# Patient Record
Sex: Female | Born: 1935 | Race: White | Hispanic: No | Marital: Married | Smoking: Never smoker
Health system: Southern US, Community
[De-identification: ages and names within clinical notes are randomized; demographics above are authoritative.]

## PROBLEM LIST (undated history)

## (undated) DIAGNOSIS — I10 Essential (primary) hypertension: Secondary | ICD-10-CM

## (undated) DIAGNOSIS — N952 Postmenopausal atrophic vaginitis: Secondary | ICD-10-CM

## (undated) DIAGNOSIS — N9489 Other specified conditions associated with female genital organs and menstrual cycle: Secondary | ICD-10-CM

## (undated) DIAGNOSIS — E039 Hypothyroidism, unspecified: Secondary | ICD-10-CM

## (undated) DIAGNOSIS — N870 Mild cervical dysplasia: Secondary | ICD-10-CM

## (undated) DIAGNOSIS — C801 Malignant (primary) neoplasm, unspecified: Secondary | ICD-10-CM

## (undated) DIAGNOSIS — E785 Hyperlipidemia, unspecified: Secondary | ICD-10-CM

## (undated) HISTORY — PX: MASTECTOMY: SHX3

## (undated) HISTORY — DX: Malignant (primary) neoplasm, unspecified: C80.1

## (undated) HISTORY — DX: Hypothyroidism, unspecified: E03.9

## (undated) HISTORY — PX: BREAST SURGERY: SHX581

## (undated) HISTORY — PX: BILATERAL SALPINGOOPHORECTOMY: SHX1223

## (undated) HISTORY — PX: COLONOSCOPY W/ POLYPECTOMY: SHX1380

## (undated) HISTORY — DX: Other specified conditions associated with female genital organs and menstrual cycle: N94.89

## (undated) HISTORY — DX: Essential (primary) hypertension: I10

## (undated) HISTORY — DX: Hyperlipidemia, unspecified: E78.5

## (undated) HISTORY — DX: Postmenopausal atrophic vaginitis: N95.2

## (undated) HISTORY — DX: Mild cervical dysplasia: N87.0

## (undated) HISTORY — PX: COLPOSCOPY: SHX161

## (undated) HISTORY — PX: APPENDECTOMY: SHX54

---

## 1980-04-30 HISTORY — PX: OOPHORECTOMY: SHX86

## 1980-04-30 HISTORY — PX: ABDOMINAL HYSTERECTOMY: SHX81

## 1997-07-29 ENCOUNTER — Encounter: Admission: RE | Admit: 1997-07-29 | Discharge: 1997-10-27 | Payer: Self-pay | Admitting: Radiation Oncology

## 1997-08-24 ENCOUNTER — Encounter: Admission: RE | Admit: 1997-08-24 | Discharge: 1997-11-22 | Payer: Self-pay | Admitting: Surgery

## 1997-10-29 ENCOUNTER — Inpatient Hospital Stay (HOSPITAL_COMMUNITY): Admission: RE | Admit: 1997-10-29 | Discharge: 1997-11-01 | Payer: Self-pay | Admitting: Plastic Surgery

## 1997-11-26 ENCOUNTER — Ambulatory Visit (HOSPITAL_COMMUNITY): Admission: RE | Admit: 1997-11-26 | Discharge: 1997-11-26 | Payer: Self-pay | Admitting: Oncology

## 1998-01-19 ENCOUNTER — Ambulatory Visit (HOSPITAL_BASED_OUTPATIENT_CLINIC_OR_DEPARTMENT_OTHER): Admission: RE | Admit: 1998-01-19 | Discharge: 1998-01-19 | Payer: Self-pay | Admitting: Plastic Surgery

## 1998-04-13 ENCOUNTER — Other Ambulatory Visit: Admission: RE | Admit: 1998-04-13 | Discharge: 1998-04-13 | Payer: Self-pay | Admitting: Plastic Surgery

## 1998-05-23 ENCOUNTER — Other Ambulatory Visit: Admission: RE | Admit: 1998-05-23 | Discharge: 1998-05-23 | Payer: Self-pay | Admitting: Radiology

## 1998-12-29 ENCOUNTER — Other Ambulatory Visit: Admission: RE | Admit: 1998-12-29 | Discharge: 1998-12-29 | Payer: Self-pay | Admitting: Obstetrics and Gynecology

## 1999-07-21 ENCOUNTER — Encounter (INDEPENDENT_AMBULATORY_CARE_PROVIDER_SITE_OTHER): Payer: Self-pay | Admitting: *Deleted

## 1999-07-21 ENCOUNTER — Ambulatory Visit (HOSPITAL_COMMUNITY): Admission: RE | Admit: 1999-07-21 | Discharge: 1999-07-21 | Payer: Self-pay | Admitting: Gastroenterology

## 1999-12-13 ENCOUNTER — Encounter: Admission: RE | Admit: 1999-12-13 | Discharge: 1999-12-13 | Payer: Self-pay | Admitting: Oncology

## 1999-12-13 ENCOUNTER — Encounter: Payer: Self-pay | Admitting: Oncology

## 2000-02-13 ENCOUNTER — Encounter: Admission: RE | Admit: 2000-02-13 | Discharge: 2000-02-13 | Payer: Self-pay | Admitting: Oncology

## 2000-02-13 ENCOUNTER — Encounter: Payer: Self-pay | Admitting: Oncology

## 2000-03-01 ENCOUNTER — Other Ambulatory Visit: Admission: RE | Admit: 2000-03-01 | Discharge: 2000-03-01 | Payer: Self-pay | Admitting: Obstetrics and Gynecology

## 2001-01-09 ENCOUNTER — Encounter: Payer: Self-pay | Admitting: Oncology

## 2001-01-09 ENCOUNTER — Encounter: Admission: RE | Admit: 2001-01-09 | Discharge: 2001-01-09 | Payer: Self-pay | Admitting: Oncology

## 2001-03-17 ENCOUNTER — Other Ambulatory Visit: Admission: RE | Admit: 2001-03-17 | Discharge: 2001-03-17 | Payer: Self-pay | Admitting: Obstetrics and Gynecology

## 2002-02-26 ENCOUNTER — Other Ambulatory Visit: Admission: RE | Admit: 2002-02-26 | Discharge: 2002-02-26 | Payer: Self-pay | Admitting: Obstetrics and Gynecology

## 2002-06-25 ENCOUNTER — Ambulatory Visit (HOSPITAL_COMMUNITY): Admission: RE | Admit: 2002-06-25 | Discharge: 2002-06-25 | Payer: Self-pay | Admitting: Oncology

## 2002-06-25 ENCOUNTER — Encounter: Payer: Self-pay | Admitting: Oncology

## 2002-11-05 ENCOUNTER — Encounter: Payer: Self-pay | Admitting: Oncology

## 2002-11-05 ENCOUNTER — Ambulatory Visit (HOSPITAL_COMMUNITY): Admission: RE | Admit: 2002-11-05 | Discharge: 2002-11-05 | Payer: Self-pay | Admitting: Oncology

## 2003-02-18 ENCOUNTER — Other Ambulatory Visit: Admission: RE | Admit: 2003-02-18 | Discharge: 2003-02-18 | Payer: Self-pay | Admitting: Obstetrics and Gynecology

## 2004-02-15 ENCOUNTER — Encounter (INDEPENDENT_AMBULATORY_CARE_PROVIDER_SITE_OTHER): Payer: Self-pay | Admitting: Specialist

## 2004-02-15 ENCOUNTER — Ambulatory Visit (HOSPITAL_COMMUNITY): Admission: RE | Admit: 2004-02-15 | Discharge: 2004-02-15 | Payer: Self-pay | Admitting: Gastroenterology

## 2004-02-23 ENCOUNTER — Other Ambulatory Visit: Admission: RE | Admit: 2004-02-23 | Discharge: 2004-02-23 | Payer: Self-pay | Admitting: Obstetrics and Gynecology

## 2004-09-06 ENCOUNTER — Encounter: Admission: RE | Admit: 2004-09-06 | Discharge: 2004-09-06 | Payer: Self-pay | Admitting: Surgery

## 2004-11-01 ENCOUNTER — Ambulatory Visit (HOSPITAL_COMMUNITY): Admission: RE | Admit: 2004-11-01 | Discharge: 2004-11-02 | Payer: Self-pay | Admitting: Surgery

## 2004-11-01 ENCOUNTER — Encounter (INDEPENDENT_AMBULATORY_CARE_PROVIDER_SITE_OTHER): Payer: Self-pay | Admitting: Specialist

## 2004-11-10 ENCOUNTER — Ambulatory Visit: Payer: Self-pay | Admitting: Oncology

## 2004-11-13 ENCOUNTER — Ambulatory Visit (HOSPITAL_COMMUNITY): Admission: RE | Admit: 2004-11-13 | Discharge: 2004-11-13 | Payer: Self-pay | Admitting: Oncology

## 2004-12-29 ENCOUNTER — Ambulatory Visit: Payer: Self-pay | Admitting: Internal Medicine

## 2005-01-17 ENCOUNTER — Ambulatory Visit: Payer: Self-pay | Admitting: Internal Medicine

## 2005-02-09 ENCOUNTER — Ambulatory Visit: Payer: Self-pay | Admitting: Internal Medicine

## 2005-02-15 ENCOUNTER — Ambulatory Visit: Payer: Self-pay | Admitting: Oncology

## 2005-02-22 ENCOUNTER — Other Ambulatory Visit: Admission: RE | Admit: 2005-02-22 | Discharge: 2005-02-22 | Payer: Self-pay | Admitting: Obstetrics and Gynecology

## 2005-10-04 ENCOUNTER — Ambulatory Visit: Payer: Self-pay | Admitting: Oncology

## 2005-10-04 ENCOUNTER — Encounter: Admission: RE | Admit: 2005-10-04 | Discharge: 2005-11-04 | Payer: Self-pay | Admitting: Surgery

## 2005-10-09 LAB — COMPREHENSIVE METABOLIC PANEL
Alkaline Phosphatase: 68 U/L (ref 39–117)
BUN: 19 mg/dL (ref 6–23)
Creatinine, Ser: 0.94 mg/dL (ref 0.40–1.20)
Glucose, Bld: 90 mg/dL (ref 70–99)
Sodium: 140 mEq/L (ref 135–145)
Total Bilirubin: 0.7 mg/dL (ref 0.3–1.2)
Total Protein: 7 g/dL (ref 6.0–8.3)

## 2005-10-09 LAB — CBC WITH DIFFERENTIAL/PLATELET
Eosinophils Absolute: 0.1 10*3/uL (ref 0.0–0.5)
LYMPH%: 27.2 % (ref 14.0–48.0)
MCV: 92.1 fL (ref 81.0–101.0)
MONO%: 7.4 % (ref 0.0–13.0)
NEUT#: 5 10*3/uL (ref 1.5–6.5)
Platelets: 234 10*3/uL (ref 145–400)
RBC: 4.51 10*6/uL (ref 3.70–5.32)

## 2005-10-09 LAB — CANCER ANTIGEN 27.29: CA 27.29: 8 U/mL (ref 0–39)

## 2005-10-23 ENCOUNTER — Ambulatory Visit (HOSPITAL_COMMUNITY): Admission: RE | Admit: 2005-10-23 | Discharge: 2005-10-23 | Payer: Self-pay | Admitting: Plastic Surgery

## 2005-11-05 ENCOUNTER — Encounter: Admission: RE | Admit: 2005-11-05 | Discharge: 2005-11-07 | Payer: Self-pay | Admitting: Surgery

## 2005-11-27 ENCOUNTER — Ambulatory Visit: Payer: Self-pay | Admitting: Internal Medicine

## 2006-02-04 ENCOUNTER — Ambulatory Visit: Payer: Self-pay | Admitting: Oncology

## 2006-02-06 LAB — CBC WITH DIFFERENTIAL/PLATELET
BASO%: 0.6 % (ref 0.0–2.0)
EOS%: 1.9 % (ref 0.0–7.0)
MCH: 31.1 pg (ref 26.0–34.0)
MCHC: 33.8 g/dL (ref 32.0–36.0)
MCV: 91.8 fL (ref 81.0–101.0)
MONO%: 9.4 % (ref 0.0–13.0)
RBC: 4.51 10*6/uL (ref 3.70–5.32)
RDW: 13.8 % (ref 11.3–14.5)

## 2006-02-06 LAB — COMPREHENSIVE METABOLIC PANEL
AST: 28 U/L (ref 0–37)
Albumin: 4.1 g/dL (ref 3.5–5.2)
Alkaline Phosphatase: 62 U/L (ref 39–117)
BUN: 26 mg/dL — ABNORMAL HIGH (ref 6–23)
Potassium: 4.1 mEq/L (ref 3.5–5.3)
Sodium: 138 mEq/L (ref 135–145)
Total Protein: 7.3 g/dL (ref 6.0–8.3)

## 2006-02-08 ENCOUNTER — Ambulatory Visit (HOSPITAL_COMMUNITY): Admission: RE | Admit: 2006-02-08 | Discharge: 2006-02-08 | Payer: Self-pay | Admitting: Oncology

## 2006-02-20 ENCOUNTER — Ambulatory Visit: Payer: Self-pay | Admitting: Internal Medicine

## 2006-02-20 LAB — CONVERTED CEMR LAB: TSH: 5.3 microintl units/mL (ref 0.35–5.50)

## 2006-02-25 ENCOUNTER — Other Ambulatory Visit: Admission: RE | Admit: 2006-02-25 | Discharge: 2006-02-25 | Payer: Self-pay | Admitting: Obstetrics and Gynecology

## 2006-02-26 ENCOUNTER — Ambulatory Visit: Payer: Self-pay | Admitting: Internal Medicine

## 2006-02-26 LAB — CONVERTED CEMR LAB: Total CK: 282 units/L (ref 7–177)

## 2006-03-01 ENCOUNTER — Ambulatory Visit: Payer: Self-pay | Admitting: Internal Medicine

## 2006-10-07 ENCOUNTER — Ambulatory Visit: Payer: Self-pay | Admitting: Oncology

## 2006-10-08 LAB — CBC WITH DIFFERENTIAL/PLATELET
Basophils Absolute: 0 10*3/uL (ref 0.0–0.1)
EOS%: 1.6 % (ref 0.0–7.0)
HGB: 13.9 g/dL (ref 11.6–15.9)
MCH: 31.6 pg (ref 26.0–34.0)
MONO%: 9.2 % (ref 0.0–13.0)
NEUT#: 4.4 10*3/uL (ref 1.5–6.5)
RBC: 4.39 10*6/uL (ref 3.70–5.32)
RDW: 13.5 % (ref 11.3–14.5)
lymph#: 2.6 10*3/uL (ref 0.9–3.3)

## 2006-10-08 LAB — COMPREHENSIVE METABOLIC PANEL
ALT: 30 U/L (ref 0–35)
AST: 27 U/L (ref 0–37)
Albumin: 4.1 g/dL (ref 3.5–5.2)
Alkaline Phosphatase: 66 U/L (ref 39–117)
BUN: 20 mg/dL (ref 6–23)
Calcium: 9.4 mg/dL (ref 8.4–10.5)
Chloride: 109 mEq/L (ref 96–112)
Potassium: 4.2 mEq/L (ref 3.5–5.3)
Sodium: 141 mEq/L (ref 135–145)
Total Protein: 6.9 g/dL (ref 6.0–8.3)

## 2006-10-22 ENCOUNTER — Encounter: Admission: RE | Admit: 2006-10-22 | Discharge: 2006-11-14 | Payer: Self-pay | Admitting: Oncology

## 2006-12-13 ENCOUNTER — Encounter: Payer: Self-pay | Admitting: Internal Medicine

## 2006-12-16 ENCOUNTER — Ambulatory Visit: Payer: Self-pay | Admitting: Internal Medicine

## 2006-12-16 DIAGNOSIS — D126 Benign neoplasm of colon, unspecified: Secondary | ICD-10-CM

## 2006-12-16 DIAGNOSIS — Z853 Personal history of malignant neoplasm of breast: Secondary | ICD-10-CM

## 2006-12-16 DIAGNOSIS — R946 Abnormal results of thyroid function studies: Secondary | ICD-10-CM

## 2006-12-21 LAB — CONVERTED CEMR LAB
Basophils Relative: 0.9 % (ref 0.0–1.0)
Eosinophils Absolute: 0.1 10*3/uL (ref 0.0–0.6)
Eosinophils Relative: 1.6 % (ref 0.0–5.0)
HCT: 42 % (ref 36.0–46.0)
MCHC: 34.4 g/dL (ref 30.0–36.0)
MCV: 91.6 fL (ref 78.0–100.0)
Monocytes Absolute: 0.6 10*3/uL (ref 0.2–0.7)
Neutro Abs: 5.2 10*3/uL (ref 1.4–7.7)
RBC: 4.58 M/uL (ref 3.87–5.11)
RDW: 12.7 % (ref 11.5–14.6)
TSH: 6.25 microintl units/mL — ABNORMAL HIGH (ref 0.35–5.50)

## 2006-12-23 ENCOUNTER — Encounter (INDEPENDENT_AMBULATORY_CARE_PROVIDER_SITE_OTHER): Payer: Self-pay | Admitting: *Deleted

## 2007-01-02 ENCOUNTER — Encounter: Payer: Self-pay | Admitting: Internal Medicine

## 2007-01-08 ENCOUNTER — Ambulatory Visit: Payer: Self-pay | Admitting: Internal Medicine

## 2007-01-09 ENCOUNTER — Encounter (INDEPENDENT_AMBULATORY_CARE_PROVIDER_SITE_OTHER): Payer: Self-pay | Admitting: *Deleted

## 2007-01-23 ENCOUNTER — Encounter: Payer: Self-pay | Admitting: Internal Medicine

## 2007-02-04 ENCOUNTER — Telehealth (INDEPENDENT_AMBULATORY_CARE_PROVIDER_SITE_OTHER): Payer: Self-pay | Admitting: *Deleted

## 2007-02-06 ENCOUNTER — Encounter: Payer: Self-pay | Admitting: Internal Medicine

## 2007-02-13 ENCOUNTER — Ambulatory Visit: Payer: Self-pay | Admitting: Oncology

## 2007-02-17 ENCOUNTER — Ambulatory Visit: Payer: Self-pay | Admitting: Internal Medicine

## 2007-02-18 ENCOUNTER — Encounter: Payer: Self-pay | Admitting: Internal Medicine

## 2007-02-18 LAB — CBC WITH DIFFERENTIAL/PLATELET
BASO%: 0.7 % (ref 0.0–2.0)
EOS%: 1.1 % (ref 0.0–7.0)
Eosinophils Absolute: 0.1 10*3/uL (ref 0.0–0.5)
LYMPH%: 28.7 % (ref 14.0–48.0)
MCH: 30.8 pg (ref 26.0–34.0)
MCHC: 34.6 g/dL (ref 32.0–36.0)
MCV: 88.9 fL (ref 81.0–101.0)
MONO%: 7.2 % (ref 0.0–13.0)
NEUT#: 5.3 10*3/uL (ref 1.5–6.5)
Platelets: 242 10*3/uL (ref 145–400)
RBC: 4.65 10*6/uL (ref 3.70–5.32)
RDW: 13.8 % (ref 11.3–14.5)

## 2007-02-21 LAB — COMPREHENSIVE METABOLIC PANEL
Alkaline Phosphatase: 66 U/L (ref 39–117)
BUN: 26 mg/dL — ABNORMAL HIGH (ref 6–23)
Glucose, Bld: 99 mg/dL (ref 70–99)
Total Bilirubin: 0.7 mg/dL (ref 0.3–1.2)

## 2007-02-21 LAB — CANCER ANTIGEN 27.29: CA 27.29: 13 U/mL (ref 0–39)

## 2007-02-21 LAB — VITAMIN D PNL(25-HYDRXY+1,25-DIHY)-BLD
Vit D, 1,25-Dihydroxy: 33 pg/mL (ref 6–62)
Vit D, 25-Hydroxy: 26 ng/mL — ABNORMAL LOW (ref 30–89)

## 2007-02-25 ENCOUNTER — Encounter (INDEPENDENT_AMBULATORY_CARE_PROVIDER_SITE_OTHER): Payer: Self-pay | Admitting: *Deleted

## 2007-02-25 LAB — CONVERTED CEMR LAB
ALT: 38 units/L — ABNORMAL HIGH (ref 0–35)
LDL Cholesterol: 74 mg/dL (ref 0–99)

## 2007-02-26 ENCOUNTER — Ambulatory Visit: Payer: Self-pay | Admitting: Internal Medicine

## 2007-02-27 ENCOUNTER — Other Ambulatory Visit: Admission: RE | Admit: 2007-02-27 | Discharge: 2007-02-27 | Payer: Self-pay | Admitting: Obstetrics and Gynecology

## 2007-09-16 ENCOUNTER — Ambulatory Visit: Payer: Self-pay | Admitting: Internal Medicine

## 2007-09-16 DIAGNOSIS — IMO0001 Reserved for inherently not codable concepts without codable children: Secondary | ICD-10-CM

## 2007-09-16 DIAGNOSIS — I10 Essential (primary) hypertension: Secondary | ICD-10-CM | POA: Insufficient documentation

## 2007-09-16 DIAGNOSIS — E785 Hyperlipidemia, unspecified: Secondary | ICD-10-CM | POA: Insufficient documentation

## 2007-09-16 LAB — CONVERTED CEMR LAB
Free T4: 0.9 ng/dL (ref 0.6–1.6)
T3, Free: 3.3 pg/mL (ref 2.3–4.2)

## 2007-09-18 ENCOUNTER — Encounter: Payer: Self-pay | Admitting: Internal Medicine

## 2007-09-19 ENCOUNTER — Encounter (INDEPENDENT_AMBULATORY_CARE_PROVIDER_SITE_OTHER): Payer: Self-pay | Admitting: *Deleted

## 2007-10-08 ENCOUNTER — Ambulatory Visit: Payer: Self-pay | Admitting: Oncology

## 2007-10-10 LAB — COMPREHENSIVE METABOLIC PANEL
BUN: 29 mg/dL — ABNORMAL HIGH (ref 6–23)
CO2: 22 mEq/L (ref 19–32)
Calcium: 9.4 mg/dL (ref 8.4–10.5)
Chloride: 105 mEq/L (ref 96–112)
Creatinine, Ser: 0.92 mg/dL (ref 0.40–1.20)
Glucose, Bld: 104 mg/dL — ABNORMAL HIGH (ref 70–99)

## 2007-10-10 LAB — CBC WITH DIFFERENTIAL/PLATELET
Basophils Absolute: 0 10*3/uL (ref 0.0–0.1)
HCT: 41.4 % (ref 34.8–46.6)
HGB: 14.4 g/dL (ref 11.6–15.9)
LYMPH%: 32.9 % (ref 14.0–48.0)
MONO#: 0.5 10*3/uL (ref 0.1–0.9)
NEUT%: 56.9 % (ref 39.6–76.8)
Platelets: 216 10*3/uL (ref 145–400)
WBC: 6.9 10*3/uL (ref 3.9–10.0)
lymph#: 2.3 10*3/uL (ref 0.9–3.3)

## 2007-10-20 ENCOUNTER — Telehealth (INDEPENDENT_AMBULATORY_CARE_PROVIDER_SITE_OTHER): Payer: Self-pay | Admitting: *Deleted

## 2007-10-27 ENCOUNTER — Encounter: Payer: Self-pay | Admitting: Internal Medicine

## 2007-11-04 ENCOUNTER — Encounter: Payer: Self-pay | Admitting: Internal Medicine

## 2007-11-19 ENCOUNTER — Encounter: Payer: Self-pay | Admitting: Internal Medicine

## 2008-02-02 ENCOUNTER — Telehealth (INDEPENDENT_AMBULATORY_CARE_PROVIDER_SITE_OTHER): Payer: Self-pay | Admitting: *Deleted

## 2008-02-13 ENCOUNTER — Ambulatory Visit: Payer: Self-pay | Admitting: Internal Medicine

## 2008-02-17 ENCOUNTER — Ambulatory Visit: Payer: Self-pay | Admitting: Oncology

## 2008-02-19 LAB — CBC WITH DIFFERENTIAL/PLATELET
BASO%: 0.4 % (ref 0.0–2.0)
HCT: 42.8 % (ref 34.8–46.6)
LYMPH%: 25.6 % (ref 14.0–48.0)
MCHC: 34.2 g/dL (ref 32.0–36.0)
MCV: 91.6 fL (ref 81.0–101.0)
MONO#: 0.6 10*3/uL (ref 0.1–0.9)
MONO%: 7.9 % (ref 0.0–13.0)
NEUT%: 65 % (ref 39.6–76.8)
Platelets: 215 10*3/uL (ref 145–400)
RBC: 4.68 10*6/uL (ref 3.70–5.32)
WBC: 7.1 10*3/uL (ref 3.9–10.0)

## 2008-02-19 LAB — COMPREHENSIVE METABOLIC PANEL
ALT: 34 U/L (ref 0–35)
Alkaline Phosphatase: 65 U/L (ref 39–117)
CO2: 26 mEq/L (ref 19–32)
Creatinine, Ser: 0.91 mg/dL (ref 0.40–1.20)
Glucose, Bld: 106 mg/dL — ABNORMAL HIGH (ref 70–99)
Sodium: 140 mEq/L (ref 135–145)
Total Bilirubin: 0.7 mg/dL (ref 0.3–1.2)

## 2008-02-19 LAB — CANCER ANTIGEN 27.29: CA 27.29: 16 U/mL (ref 0–39)

## 2008-02-23 ENCOUNTER — Ambulatory Visit: Payer: Self-pay | Admitting: Internal Medicine

## 2008-02-23 DIAGNOSIS — E039 Hypothyroidism, unspecified: Secondary | ICD-10-CM | POA: Insufficient documentation

## 2008-02-23 DIAGNOSIS — R7309 Other abnormal glucose: Secondary | ICD-10-CM | POA: Insufficient documentation

## 2008-02-23 LAB — CONVERTED CEMR LAB
Cholesterol, target level: 200 mg/dL
HDL goal, serum: 40 mg/dL
LDL Goal: 130 mg/dL

## 2008-02-24 ENCOUNTER — Encounter (INDEPENDENT_AMBULATORY_CARE_PROVIDER_SITE_OTHER): Payer: Self-pay | Admitting: *Deleted

## 2008-02-24 LAB — CONVERTED CEMR LAB
Hgb A1c MFr Bld: 5.9 % (ref 4.6–6.0)
Microalb, Ur: 0.3 mg/dL (ref 0.0–1.9)

## 2008-02-26 ENCOUNTER — Encounter: Payer: Self-pay | Admitting: Internal Medicine

## 2008-02-27 ENCOUNTER — Other Ambulatory Visit: Admission: RE | Admit: 2008-02-27 | Discharge: 2008-02-27 | Payer: Self-pay | Admitting: Obstetrics and Gynecology

## 2008-02-27 ENCOUNTER — Encounter: Payer: Self-pay | Admitting: Obstetrics and Gynecology

## 2008-02-27 ENCOUNTER — Ambulatory Visit: Payer: Self-pay | Admitting: Obstetrics and Gynecology

## 2008-02-27 LAB — CONVERTED CEMR LAB
ALT: 37 units/L — ABNORMAL HIGH (ref 0–35)
AST: 34 units/L (ref 0–37)
Alkaline Phosphatase: 59 units/L (ref 39–117)
Bilirubin, Direct: 0.2 mg/dL (ref 0.0–0.3)
Calcium: 9.2 mg/dL (ref 8.4–10.5)
Cholesterol: 143 mg/dL (ref 0–200)
Creatinine, Ser: 0.8 mg/dL (ref 0.4–1.2)
LDL Cholesterol: 78 mg/dL (ref 0–99)
Sodium: 141 meq/L (ref 135–145)
TSH: 3.78 microintl units/mL (ref 0.35–5.50)
Total CHOL/HDL Ratio: 2.8
Total Protein: 7.1 g/dL (ref 6.0–8.3)
VLDL: 14 mg/dL (ref 0–40)

## 2008-07-19 ENCOUNTER — Encounter: Payer: Self-pay | Admitting: Internal Medicine

## 2008-07-21 ENCOUNTER — Encounter: Payer: Self-pay | Admitting: Internal Medicine

## 2008-10-15 ENCOUNTER — Encounter: Payer: Self-pay | Admitting: Internal Medicine

## 2008-11-19 ENCOUNTER — Encounter: Payer: Self-pay | Admitting: Internal Medicine

## 2008-12-08 ENCOUNTER — Ambulatory Visit: Payer: Self-pay | Admitting: Internal Medicine

## 2008-12-11 LAB — CONVERTED CEMR LAB
ALT: 55 units/L — ABNORMAL HIGH (ref 0–35)
AST: 46 units/L — ABNORMAL HIGH (ref 0–37)
Albumin: 3.8 g/dL (ref 3.5–5.2)
Alkaline Phosphatase: 54 units/L (ref 39–117)
Basophils Absolute: 0.1 10*3/uL (ref 0.0–0.1)
Basophils Relative: 0.9 % (ref 0.0–3.0)
Bilirubin Urine: NEGATIVE
CO2: 28 meq/L (ref 19–32)
Calcium: 9.2 mg/dL (ref 8.4–10.5)
GFR calc non Af Amer: 65.29 mL/min (ref >60)
Glucose, Bld: 101 mg/dL — ABNORMAL HIGH (ref 70–99)
Ketones, ur: NEGATIVE mg/dL
Lymphs Abs: 2.3 10*3/uL (ref 0.7–4.0)
MCHC: 34 g/dL (ref 30.0–36.0)
Monocytes Absolute: 0.6 10*3/uL (ref 0.1–1.0)
Monocytes Relative: 8.4 % (ref 3.0–12.0)
Neutrophils Relative %: 55.1 % (ref 43.0–77.0)
Nitrite: NEGATIVE
Platelets: 186 10*3/uL (ref 150.0–400.0)
Potassium: 4 meq/L (ref 3.5–5.1)
RBC: 4.48 M/uL (ref 3.87–5.11)
Sodium: 142 meq/L (ref 135–145)
Specific Gravity, Urine: 1.02 (ref 1.000–1.030)
VLDL: 14.4 mg/dL (ref 0.0–40.0)
pH: 6.5 (ref 5.0–8.0)

## 2008-12-13 ENCOUNTER — Encounter (INDEPENDENT_AMBULATORY_CARE_PROVIDER_SITE_OTHER): Payer: Self-pay | Admitting: *Deleted

## 2008-12-14 ENCOUNTER — Ambulatory Visit: Payer: Self-pay | Admitting: Internal Medicine

## 2008-12-14 LAB — CONVERTED CEMR LAB
OCCULT 2: NEGATIVE
OCCULT 3: NEGATIVE

## 2008-12-15 ENCOUNTER — Encounter (INDEPENDENT_AMBULATORY_CARE_PROVIDER_SITE_OTHER): Payer: Self-pay | Admitting: *Deleted

## 2008-12-15 ENCOUNTER — Ambulatory Visit: Payer: Self-pay | Admitting: Internal Medicine

## 2008-12-15 DIAGNOSIS — I890 Lymphedema, not elsewhere classified: Secondary | ICD-10-CM

## 2008-12-15 DIAGNOSIS — Z860100 Personal history of colon polyps, unspecified: Secondary | ICD-10-CM | POA: Insufficient documentation

## 2008-12-15 DIAGNOSIS — R740 Nonspecific elevation of levels of transaminase and lactic acid dehydrogenase [LDH]: Secondary | ICD-10-CM

## 2008-12-20 ENCOUNTER — Telehealth: Payer: Self-pay | Admitting: Internal Medicine

## 2009-01-12 ENCOUNTER — Ambulatory Visit: Payer: Self-pay | Admitting: Internal Medicine

## 2009-01-16 LAB — CONVERTED CEMR LAB
AST: 38 units/L — ABNORMAL HIGH (ref 0–37)
Bilirubin, Direct: 0.1 mg/dL (ref 0.0–0.3)
Total Bilirubin: 0.9 mg/dL (ref 0.3–1.2)

## 2009-01-17 ENCOUNTER — Encounter (INDEPENDENT_AMBULATORY_CARE_PROVIDER_SITE_OTHER): Payer: Self-pay | Admitting: *Deleted

## 2009-02-02 ENCOUNTER — Ambulatory Visit: Payer: Self-pay | Admitting: Internal Medicine

## 2009-02-04 ENCOUNTER — Ambulatory Visit: Payer: Self-pay | Admitting: Oncology

## 2009-02-08 LAB — COMPREHENSIVE METABOLIC PANEL
ALT: 46 U/L — ABNORMAL HIGH (ref 0–35)
AST: 35 U/L (ref 0–37)
Albumin: 4.1 g/dL (ref 3.5–5.2)
Calcium: 9.3 mg/dL (ref 8.4–10.5)
Chloride: 105 mEq/L (ref 96–112)
Potassium: 4.1 mEq/L (ref 3.5–5.3)
Total Protein: 6.5 g/dL (ref 6.0–8.3)

## 2009-02-08 LAB — CBC WITH DIFFERENTIAL/PLATELET
BASO%: 0.6 % (ref 0.0–2.0)
EOS%: 1 % (ref 0.0–7.0)
HGB: 13.8 g/dL (ref 11.6–15.9)
MCH: 31.7 pg (ref 25.1–34.0)
MCHC: 34.4 g/dL (ref 31.5–36.0)
MONO#: 0.5 10*3/uL (ref 0.1–0.9)
RDW: 13.4 % (ref 11.2–14.5)
WBC: 7.4 10*3/uL (ref 3.9–10.3)
lymph#: 2.5 10*3/uL (ref 0.9–3.3)

## 2009-02-14 ENCOUNTER — Encounter: Payer: Self-pay | Admitting: Internal Medicine

## 2009-02-23 ENCOUNTER — Ambulatory Visit: Payer: Self-pay | Admitting: Obstetrics and Gynecology

## 2009-02-23 ENCOUNTER — Other Ambulatory Visit: Admission: RE | Admit: 2009-02-23 | Discharge: 2009-02-23 | Payer: Self-pay | Admitting: Obstetrics and Gynecology

## 2009-02-23 ENCOUNTER — Encounter: Payer: Self-pay | Admitting: Obstetrics and Gynecology

## 2009-06-28 ENCOUNTER — Encounter: Payer: Self-pay | Admitting: Internal Medicine

## 2009-10-13 ENCOUNTER — Ambulatory Visit: Payer: Self-pay | Admitting: Obstetrics and Gynecology

## 2009-11-17 ENCOUNTER — Telehealth (INDEPENDENT_AMBULATORY_CARE_PROVIDER_SITE_OTHER): Payer: Self-pay | Admitting: *Deleted

## 2009-11-23 ENCOUNTER — Encounter: Payer: Self-pay | Admitting: Internal Medicine

## 2009-12-29 ENCOUNTER — Encounter: Payer: Self-pay | Admitting: Internal Medicine

## 2010-01-05 ENCOUNTER — Ambulatory Visit: Payer: Self-pay | Admitting: Internal Medicine

## 2010-01-05 DIAGNOSIS — S59919A Unspecified injury of unspecified forearm, initial encounter: Secondary | ICD-10-CM

## 2010-01-05 DIAGNOSIS — M949 Disorder of cartilage, unspecified: Secondary | ICD-10-CM

## 2010-01-05 DIAGNOSIS — M899 Disorder of bone, unspecified: Secondary | ICD-10-CM | POA: Insufficient documentation

## 2010-01-05 DIAGNOSIS — S6990XA Unspecified injury of unspecified wrist, hand and finger(s), initial encounter: Secondary | ICD-10-CM

## 2010-01-05 DIAGNOSIS — S59909A Unspecified injury of unspecified elbow, initial encounter: Secondary | ICD-10-CM

## 2010-01-20 ENCOUNTER — Ambulatory Visit: Payer: Self-pay | Admitting: Oncology

## 2010-01-23 ENCOUNTER — Telehealth (INDEPENDENT_AMBULATORY_CARE_PROVIDER_SITE_OTHER): Payer: Self-pay | Admitting: *Deleted

## 2010-01-24 ENCOUNTER — Telehealth: Payer: Self-pay | Admitting: Internal Medicine

## 2010-01-24 LAB — CBC WITH DIFFERENTIAL/PLATELET
BASO%: 0.6 % (ref 0.0–2.0)
LYMPH%: 33.3 % (ref 14.0–49.7)
MCH: 31.7 pg (ref 25.1–34.0)
MCHC: 34.3 g/dL (ref 31.5–36.0)
MCV: 92.2 fL (ref 79.5–101.0)
NEUT#: 4.1 10*3/uL (ref 1.5–6.5)
NEUT%: 56.8 % (ref 38.4–76.8)
RDW: 13.5 % (ref 11.2–14.5)
WBC: 7.3 10*3/uL (ref 3.9–10.3)

## 2010-01-24 LAB — COMPREHENSIVE METABOLIC PANEL
AST: 31 U/L (ref 0–37)
Alkaline Phosphatase: 59 U/L (ref 39–117)
BUN: 17 mg/dL (ref 6–23)
CO2: 20 mEq/L (ref 19–32)
Creatinine, Ser: 0.81 mg/dL (ref 0.40–1.20)
Sodium: 138 mEq/L (ref 135–145)

## 2010-01-31 ENCOUNTER — Encounter: Payer: Self-pay | Admitting: Internal Medicine

## 2010-02-03 ENCOUNTER — Encounter: Payer: Self-pay | Admitting: Internal Medicine

## 2010-02-21 ENCOUNTER — Telehealth: Payer: Self-pay | Admitting: Internal Medicine

## 2010-02-23 ENCOUNTER — Ambulatory Visit: Payer: Self-pay | Admitting: Obstetrics and Gynecology

## 2010-05-30 NOTE — Assessment & Plan Note (Signed)
Summary: 6 MONTH FOLLOWUP, DISCUSS BONE DENSITY////SPH   Vital Signs:  Patient profile:   77 year old female Weight:      169.8 pounds BMI:     31.94 Temp:     98.3 degrees F oral Pulse rate:   76 / minute Resp:     14 per minute BP sitting:   124 / 86  (left arm) Cuff size:   large  Vitals Entered By: Shonna Chock CMA (January 05, 2010 10:17 AM)  History of Present Illness: Labs 06/2009 from [place] reviewed ; all excellent except vitamin D level minimally below goal of 40-60 despite 2000 IU vit D daily & calcium 500 mg daily(with 600 IU vit D). BMD reviewed: all sites stable to improved vs 2007 EXCEPT Osteopenia @ L femoral neck(T score -1.6, -1.1 in 2007).No PMH of fractures; no FH of Osteoporosis.Walkiing 30-40 min3-4 X /week.Injury R wrist post fall 2 weeks ago. Better with ice initially but tender with  certain positions  Allergies: 1)  Sulfa 2)  Neomycin  Past History:  Past Medical History: Breast cancer, hx of Hyperlipidemia Hypertension Hypothyroidism Colonic polyps, hx of Osteopenia:  T score -1.6 @ L femoral neck 2011 ([place])  Physical Exam  General:  well-nourished, alert,appropriate and cooperative throughout examination Heart:  normal rate, regular rhythm, no gallop, no rub, no JVD, and grade 1/2 -1  /6 systolic murmur.   Pulses:  R and L carotid,radial,dorsalis pedis and posterior tibial pulses are full and equal bilaterally Extremities:  No clubbing, cyanosis, edema  noted . normal full range of motion of all joints.   OA hand changes. Crepitus of shoulders. Some positional  pain with ROM R wrist   Impression & Recommendations:  Problem # 1:  OSTEOPENIA (ICD-733.90)  Problem # 2:  WRIST INJURY, RIGHT (ICD-959.3) pain med declined  Complete Medication List: 1)  Atenolol 50 Mg Tabs (Atenolol) .Marland Kitchen.. 1 by mouth qd 2)  Vytorin 10-20 Mg Tabs (Ezetimibe-simvastatin) .... Daily as directed 3)  Multivitamin  4)  Fish Oil  5)  Calcium 2 Tabs Daily  6)  Co  Q-10 Vitamin E Fish Oil 60-90-25-200 Caps (Dha-epa-coenzyme q10-vitamin e) .... Qd 7)  Synthroid 25 Mcg Tabs (Levothyroxine sodium) .Marland Kitchen.. 1 qd 8)  Lime-fruit  .Marland Kitchen.. 1 lime before breakfast for arthritis  Other Orders: Flu Vaccine 31yrs + MEDICARE PATIENTS (Q6578) Administration Flu vaccine - MCR (I6962) Flu Vaccine Consent Questions     Do you have a history of severe allergic reactions to this vaccine? no    Any prior history of allergic reactions to egg and/or gelatin? no    Do you have a sensitivity to the preservative Thimersol? no    Do you have a past history of Guillan-Barre Syndrome? no    Do you currently have an acute febrile illness? no    Have you ever had a severe reaction to latex? no    Vaccine information given and explained to patient? yes    Are you currently pregnant? no    Lot Number:[id number]   Exp Date:10/28/2010   Site Given  Right  Deltoid IMers: Flu Vaccine 28yrs + MEDICARE PATIENTS (X5284) Administration Flu vaccine - MCR (X3244)  Patient Instructions: 1)  Hand Specialist referral if no better with Glucosamine 1500 mg once daily X 4 weeks. Calcium 500 mg + 600 International Units vit D3 two times a day .BMD in 25 months. Prescriptions: SYNTHROID 25 MCG  TABS (LEVOTHYROXINE SODIUM) 1 qd  #90 x 3  Entered and Authorized by:   [name] MD   Signed by:   [name] MD on 01/05/2010   Method used:   Print then Give to Patient   RxID:   [id number] ATENOLOL 50 MG  TABS (ATENOLOL) 1 by mouth qd  #90 x 3   Entered and Authorized by:   [name] MD   Signed by:   [name] MD on 01/05/2010   Method used:   Print then Give to Patient   RxID:   [id number] VYTORIN 10-20 MG  TABS (EZETIMIBE-SIMVASTATIN) daily as directed  #30 x 5   Entered and Authorized by:   [name] MD   Signed by:   [name] MD on 01/05/2010   Method used:   Print then Give to Patient   RxID:   [id number]  .lbmedflu

## 2010-05-30 NOTE — Letter (Signed)
Summary: [facility]  [facility]   Imported By: Lanelle Bal 02/20/2010 14:42:02  _____________________________________________________________________  External Attachment:    Type:   Image     Comment:   External Document

## 2010-05-30 NOTE — Consult Note (Signed)
Summary: [facility]  [facility]   Imported By: Lanelle Bal 02/20/2010 14:28:29  _____________________________________________________________________  External Attachment:    Type:   Image     Comment:   External Document

## 2010-05-30 NOTE — Progress Notes (Signed)
Summary: Refill Request  Phone Note Refill Request Message from:  Pharmacy on November 17, 2009 10:47 AM  Refills Requested: Medication #1:  VYTORIN 10-20 MG  TABS 1/2 by mouth qd   Dosage confirmed as above?Dosage Confirmed   Supply Requested: 1 month [facility]  Next Appointment Scheduled: none Initial call taken by: Lavell Islam,  November 17, 2009 10:48 AM  Follow-up for Phone Call        Patient needs to schedule a yearly appointment Follow-up by: Shonna Chock CMA,  November 17, 2009 10:59 AM    New/Updated Medications: VYTORIN 10-20 MG  TABS (EZETIMIBE-SIMVASTATIN) 1/2 by mouth once daily **APPOINTMENT DUE** Prescriptions: VYTORIN 10-20 MG  TABS (EZETIMIBE-SIMVASTATIN) 1/2 by mouth once daily **APPOINTMENT DUE**  #15 x 1   Entered by:   Shonna Chock CMA   Authorized by:   [name] MD   Signed by:   Shonna Chock CMA on 11/17/2009   Method used:   Electronically to        [facility]. [phone]* (retail)       [phone]       [place], [place]  [address]       Ph: [phone]       Fax: [phone]   RxID:   [id number]

## 2010-05-30 NOTE — Progress Notes (Signed)
Summary: NEEDS REFERRAL FOR HAND DOCTOR  Phone Note Call from Patient Call back at Home Phone [phone]   Caller: Patient Summary of Call: PATIENT CALLED TO INQUIRE ABOUT REFERRAL TO HAND DOCTOR--TOLD HER REFERRAL WAS IN PROCESS, BUT [facility] SOMETIMES TOOK A WHILE TO RESPOND\par  SHE IS CONCERNED BECAUSE SHE IS LEAVING FOR [place] ON OCT 30TH AND NEEDS HAND PROBLEM TO BE "FINISHED" BY THAT DATE--CAN YOU FAX A REQUEST TO THEM TO CALL PATIENT AS SOON AS POSSIBLE?? Initial call taken by: Jerolyn Shin,  January 24, 2010 4:17 PM  Follow-up for Phone Call        REFERRAL HAS ALREADY BEEN FAXED TO [facility] TO SEE THEIR HAND SPECIALIST EARLIER TODAY, 01-24-2010, PATIENT WILL BE CONTACTED FOR AN APPT BY THE END OF THIS WEEK. Follow-up by: Magdalen Spatz [facility],  January 24, 2010 4:57 PM

## 2010-05-30 NOTE — Progress Notes (Signed)
Summary: med issue  Phone Note Call from Patient Call back at [facility] Phone [phone]   Summary of Call: Patient left message on triage that she has accidentally taken 2 of her thyroid pills one hour apart. Patient simply forgot that she had taken it. Patient would like to know if she should be worried, what side effects will she have, and what MD recommends. Please advise. Initial call taken by: Lucious Groves CMA,  February 21, 2010 11:26 AM  Follow-up for Phone Call        No risk as this is very small dose. call if persistent palpitations present. Follow-up by: [name] MD,  February 21, 2010 12:48 PM  Additional Follow-up for Phone Call Additional follow up Details #1::        Patient notified. Additional Follow-up by: Lucious Groves CMA,  February 21, 2010 2:31 PM

## 2010-05-30 NOTE — Progress Notes (Signed)
Summary: hand specialist referral  Phone Note Call from Patient Call back at Home Phone [phone]   Caller: Patient Summary of Call: patient called says that hand isn't any better and would like to go ahead w/ referral to hand specialist.  Follow-up for Phone Call        left msg w/ husband to have patient contact office.......Marland KitchenDoristine Devoid CMA  January 23, 2010 3:12 PM    spoke w/ patient aware referral put in and we will contact her w/ information.......Marland KitchenDoristine Devoid CMA  January 23, 2010 4:45 PM

## 2010-06-02 NOTE — Letter (Signed)
Summary: [facility]   Imported By: Lanelle Bal 12/05/2009 13:18:01  _____________________________________________________________________  External Attachment:    Type:   Image     Comment:   External Document

## 2010-09-15 NOTE — Procedures (Signed)
[place]. [facility]  Patient:    Andrea Kirby, Andrea Kirby                    MRN: [id number] Proc. Date: 07/21/99 Adm. Date:  [id number] Attending:  Rich Brave CC:         Valentino Hue. Magrinat, M.D.                           Procedure Report  PROCEDURE:  Colonoscopy with biopsy.  INDICATION:  A 77 year old with history of breast cancer who wanted colon cancer screening and who has had periodic hemorrhoidal irritation.  FINDINGS:  Diminutive polyp at 20 cm removed by cold biopsy, otherwise normal exam to the terminal ileum.  SEDATION:  Fentanyl 75 mcg and Versed 7.5 mg IV without arrhythmia or desaturation.  DESCRIPTION OF PROCEDURE:  The nature, purpose, and risks of the procedure had een discussed with the patient who provided written consent.  The Olympus adjustable tension pediatric video colonoscopy was easily advanced o the terminal ileum and pullback was then performed.  The ileum had a normal mucosal appearance.  The quality of the prep was excellent and it is felt that all areas are well seen.  The only abnormality on this exam was a 2 mm sessile hyperplastic-appearing polyp at about 20 cm from the external anal opening removed by a single cold biopsy. No other polyps were seen and there was no evidence of cancer, vascular malformations or diverticular disease.  Retroflexion of the rectum was unremarkable.  The patient tolerated the procedure well and there were no apparent complications.  IMPRESSION:  Essentially normal colonoscopy.  Diminutive rectosigmoid polyp removed as described above.  PLAN:  Await pathology of the polyp. DD:  07/21/99 TD:  07/22/99 Job: [id number]

## 2010-09-15 NOTE — Op Note (Signed)
Andrea Kirby, Andrea Kirby             ACCOUNT NO.:  [id number]   MEDICAL RECORD NO.:  [id number]          PATIENT TYPE:  AMB   LOCATION:  SDS                          FACILITY:  [facility]   PHYSICIAN:  Consuello Bossier., M.D.DATE OF BIRTH:  1933/12/20   DATE OF PROCEDURE:  10/23/2005  DATE OF DISCHARGE:                                 OPERATIVE REPORT   PREOPERATIVE DIAGNOSIS:  Personal history of carcinoma of breast and right  tissue expander in place with capsular contractor.   OPERATION:  Right open capsulotomy, replacement of tissue expander with  Mentor smooth-walled gel-filled prosthesis, 500 cc.   SURGEON:  Pleas Patricia, M.D.   ANESTHESIA:  General endotracheal.   FINDINGS:  The patient had a previous left mastectomy followed by a left  breast TRAM reconstruction and then developed right breast cancer, for which  a tissue expander had been placed.  She had been expanded to 500 cc of  normal saline.  She had a capsule contracture, and the above surgical  procedure was carried out.   PROCEDURE:  Patient was brought to the operating room, given a general  endotracheal anesthetic, prepped with Betadine and draped about both breasts  in a sterile fashion.  The previous transverse incision was utilized.  The  dissection continued down until the pectoralis major fascia and the very  thin muscle was split in the direction of its fibers.  Tissue expander was  able to be deflated and then removed.  With Fiberoptic illumination, a  generous open capsulotomy was performed in all directions, but particularly  attention was paid to the inferior lateral and medial aspects.  Bleeding was  controlled with electrocautery.  There was noted good hemostasis.  The wound  was irrigated with normal saline.  At this point, the 500 cc gel-filled  prosthesis was placed and appeared to be reasonable in terms of the size and  position were relative to the TRAM flap reconstruction on the left  breast.  The muscle was closed with interrupted 3-0 Vicryl.  The subcutaneous tissues  closed with interrupted 4-0 Monocryl, followed by a running subcuticular 4-0  Monocryl.  Steri-Strips, Xeroform, 4x8s, ABD, and a circumthoracic Ace  bandage were applied.  The patient tolerated the procedure well and was able  to be discharged from the operating room to the recovery room and  subsequently to be followed by me as an outpatient.      Consuello Bossier., M.D.  Electronically Signed     HH/MEDQ  D:  10/23/2005  T:  10/23/2005  Job:  [id number]

## 2010-09-15 NOTE — Op Note (Signed)
NAMEXARENI, KELCH             ACCOUNT NO.:  [id number]   MEDICAL RECORD NO.:  [id number]          PATIENT TYPE:  AMB   LOCATION:  SDS                          FACILITY:  [facility]   PHYSICIAN:  Consuello Bossier., M.D.DATE OF BIRTH:  Mar 30, 1934   DATE OF PROCEDURE:  10/23/2005  DATE OF DISCHARGE:  10/23/2005                                 OPERATIVE REPORT   This is a redictation.   PREOPERATIVE DIAGNOSIS:  Personal history of carcinoma of breast with  patient having previous left TRAM flap reconstruction and right breast  tissue expander in place for right open capsulotomy and removal of tissue  expander and placement of permanent implant.   POSTOPERATIVE DIAGNOSIS:  Personal history of carcinoma of breast with  patient having previous left TRAM flap reconstruction and right breast  tissue expander in place for right open capsulotomy and removal of tissue  expander and placement of permanent implant.   SURGEON:  Pleas Patricia, M.D.   ANESTHESIA:  General endotracheal.   FINDINGS:  The patient had a previous left breast cancer treated by TRAM  flap reconstruction and had developed a right breast cancer for which a  tissue expander and reconstruction had been done and she was admitted for  the above surgical procedure.   DESCRIPTION OF PROCEDURE:  The patient was brought to the operating room,  having been marked off for the planned surgical procedure, given a general  endotracheal anesthetic, prepped with Betadine and draped about both breasts  in a sterile fashion.  The previous transverse mastectomy scar was excised  and dissection continued down through the subcutaneous tissues until the  pectoralis major fascia was encountered.  The muscle was split in the  direction of its fibers and the anterior capsule opened and the tissue  expander removed.  With fiberoptic illumination, generous right open  capsulotomy was performed trying to position the implant in its  ideal  position as possible related to the left breast TRAM flap reconstruction.  Bleeding was controlled with electrocautery unit and there was noted to be  good hemostasis.  The implant used was a Mentor smooth walled gel-filled  prosthesis, 500 mL.  At this point the muscle was closed with interrupted #3-  0 Vicryl and the skin closed with running subcutaneous interrupted #3-0  Monocryl followed by a running subcuticular #4-0 Monocryl.  Steri-Strips,  Xeroform, fluffs and a circumthroacic Ace bandage were then applied.  The  patient tolerated the procedure well, was able to be discharged from the  operating room to the recovery room subsequently to be followed by me as an  outpatient.      Consuello Bossier., M.D.  Electronically Signed     HH/MEDQ  D:  12/10/2005  T:  12/11/2005  Job:  [id number]

## 2010-09-15 NOTE — Assessment & Plan Note (Signed)
[facility]                          [place] OFFICE NOTE   DELVINA, MIZZELL                    MRN:          [id number]  DATE:03/01/2006                            DOB:          1933-10-26    Andrea Kirby was seen March 01, 2006, for followup of an elevated CPK.  On October 20, her CPK was 282, with SGOT of 37 and GPT of 44.  TSH was  therapeutic at 5.30.  Her most recent lipids were completed November 29, 2005;  total cholesterol was 124, triglyceride  49, HDL 44, and LDL 71 on Vytorin  10/20.   She was walking 35 minutes every other day without cardiopulmonary symptoms.  She was on no specific diet.   Blood pressure was well-controlled at 128/80 and pulse was 52 and regular.  She had gained approximately 6 pounds, to 166.  Cardiopulmonary exam was  unremarkable.  She had no icterus or jaundice and no muscle tenderness.   I have recommended that she decrease the Vytorin 10/20 to one half at  bedtime and have followup fasting lipids, SGOT and GPT in 10-11 weeks.  Her  LDL goal is less than 100.  If she does have myalgias, I gave her the option  of taking Co-Enzyme Q10.   A copy of this will be sent to her home address.  She will be spending an  extended period of time in [place] and also visiting [place].  It is  anticipated these labs will be done by her physician in [place], Dr. Donnald Garre to  whom a copy will be sent also.     [name]. Alwyn Ren, MD,FACP,FCCP  Electronically Signed    [facility]/MedQ  DD: 03/08/2006  DT: 03/08/2006  Job #: [id number]   cc:   Judyann Munson

## 2010-09-15 NOTE — Op Note (Signed)
NAMEGRAHAM, Andrea Kirby             ACCOUNT NO.:  [id number]   MEDICAL RECORD NO.:  [id number]          PATIENT TYPE:  AMB   LOCATION:  ENDO                         [facility]ACILITY[facility]  [facility]   PHYSICIAN:  Bernette Redbird, M.D.   DATE OF BIRTH:  04/07/1935   DATE OF PROCEDURE:  02/15/2004  DATE OF DISCHARGE:                                 OPERATIVE REPORT   PROCEDURE:  Colonoscopy with biopsy.   INDICATIONS FOR PROCEDURE:  Intermittent rectal bleeding and followup of  diminutive colonic adenoma removed 4 1/2 years ago.   FINDINGS:  Two diminutive polyps removed.  No definite source of rectal  bleeding identified.   DESCRIPTION OF PROCEDURE:  The nature, purpose and risk of the procedure  were familiar to the patient from prior examination and she provided written  consent. Sedation was fentanyl 50 mcg and Versed 6 mg IV without arrhythmias  or desaturation.  The Olympus adjustable tension pediatric video colonoscope  was advanced with moderate difficulty through an angulated and slightly  fixated region. The scope was advanced successfully to the terminal ileum  which had a normal appearance and pullback was then performed. The quality  of the prep was excellent. It is felt that all areas were well seen.   Two diminutive polyps were identified and removed by cold biopsy technique  during this exam.  One was a short distance above the cecum, the other was  in the distal sigmoid region.   No large polyps, cancer, colitis, vascular malformations or diverticulosis  were noted. On my recollection, both retroflexion in the rectum and  reinspection of the rectum were unremarkable.   The patient tolerated the procedure well and there were no apparent  complications.   IMPRESSION:  1.  Diminutive colon polyps removed as described above (211.3).  2.  Definite source of rectal bleeding identified. It is presumed to be of      hemorrhoidal origin based on the absence of alternative  explanations.     RB/MEDQ  D:  02/15/2004  T:  02/15/2004  Job:  [id number]   cc:   Valentino Hue. Magrinat, M.D.  [address] [facility]  [place]  [place] [address]  Fax: [phone]

## 2010-09-15 NOTE — Op Note (Signed)
NAMENIMRAT, WOOLWORTH             ACCOUNT NO.:  [id number]   MEDICAL RECORD NO.:  [id number]          PATIENT TYPE:  OIB   LOCATION:  2550                         FACILITY:  [facility]   PHYSICIAN:  Currie Paris, M.D.DATE OF BIRTH:  [birth year]   DATE OF PROCEDURE:  11/01/2004  DATE OF DISCHARGE:                                 OPERATIVE REPORT   CCS #[id number]   PREOPERATIVE DIAGNOSIS:  Ductal carcinoma in situ, right breast.   POSTOPERATIVE DIAGNOSIS:  Ductal carcinoma in situ, right breast.   OPERATION:  Right total mastectomy, blue dye injection, and axillary  sentinel node biopsy (two nodes).   SURGEON:  Currie Paris, M.D.   ASSISTANT:  Rose Phi. Maple Hudson, M.D.   ANESTHESIA:  General endotracheal.   CLINICAL HISTORY:  This is a 77 year old lady who has had a prior left  mastectomy for invasive cancer, who has now developed right breast cancer.  After a lengthy discussion, she elected to have a right total mastectomy  with sentinel lymph node biopsy.  She also planned for a tissue expander  reconstruction by Dr. Stephens November at the completion of the mastectomy.   DESCRIPTION OF PROCEDURE:  The patient was seen in the holding area, and she  had no further questions.  The right breast was identified as the operative  side and initialed by me.  Her radioactive isotope was injected.  She had no  further questions.   She was taken to the operating room and after satisfactory general  anesthesia had been obtained, the breast was prepped with some alcohol and  the time-out occurred.  I then injected 5 mL of methylene blue subareolarly  for the sentinel lymph node.   The breast was then prepped with Betadine, draping on both breasts so we  would have exposure to the left side for symmetry during the placement of  the implant.  I outlined an elliptical incision and marked the sternum and  inframammary folds.   I used the NeoProbe and identified a slightly hot area in the  axilla.  I  then made the inferior half of the mastectomy transverse incision and  elevated a flap medially to the sternum, inferiorly to the inframammary fold  abd laterally towards the latissimus.  The superior skin incision was then  made and flaps were raised to the sternum, clavicle and out into the axilla.  I opened into the axilla and I got the NeoProbe, and I got absolutely 0  counts.  At this point we were unsure why that was and kept searching for a  hot area, but only got low counts around the breast itself at the injection  site.  While waiting to try a new cord to see if that was the problem, I  went ahead and identified a blue lymphatic leading to a blue node, which was  then removed and sent as a sentinel node biopsy.  I did not see any other  obvious nodes but elected not to do any further dissection until we had the  NeoProbe functioning.   I then went back with the mastectomy.  We  had already gotten the flaps  raised, so I started medially and took the breast off from medial to  lateral.  When I got to the edge of the pectoralis, I divided that and then  divided the lateral to the chest wall and a couple of the low axillary nodes  came out as we were pulling down on the axilla.  At this point, we had a new  NeoProbe and this worked appropriately with high counts of the breast.  I  ran this over the little bit of axillary tissue that came out of the breast  and identified a hot area, and a little bit of dissection showed a blue  lymphatic leading to a blue node.  This was also sent as a sentinel node and  was negative.  With that out, there no other hot areas in the breast tissue  that we had removed nor any and the remaining axilla, no other blue nodes  and no other palpable abnormal areas.   I irrigated and made sure everything was dry.  It appeared okay.  At this  point we scrubbed out to let Dr. Stephens November do the reconstruction.  Estimated blood loss was less than  100 mL.  There were no complications.       CJS/MEDQ  D:  11/01/2004  T:  11/01/2004  Job:  [id number]   cc:   Valentino Hue. Magrinat, M.D.  [address] [facility]  [place]  [place] [address]  Fax: [phone]

## 2010-09-15 NOTE — Op Note (Signed)
Andrea Kirby, Andrea Kirby             ACCOUNT NO.:  [id number]   MEDICAL RECORD NO.:  [id number]          PATIENT TYPE:  OIB   LOCATION:  2550                         FACILITY:  [facility]   PHYSICIAN:  Consuello Bossier., M.D.DATE OF BIRTH:  03-20-1934   DATE OF PROCEDURE:  11/01/2004  DATE OF DISCHARGE:                                 OPERATIVE REPORT   PREOPERATIVE DIAGNOSIS:  Personal history of carcinoma of the right breast  and acquired absence of the right breast.   POSTOPERATIVE DIAGNOSIS:  Personal history of carcinoma of the right breast  and acquired absence of the right breast.   OPERATION:  Right breast immediate reconstruction with insertion of McGhan  600 mL tissue expander.   SURGEON:  Pleas Patricia, M.D.   ANESTHESIA:  General endotracheal.   FINDINGS:  Please see Dr. Barbaraann Share operative note for his sentinel  node biopsy and right complete mastectomy.  The wound was inspected, and it  was elected at this point to open the pectoralis major in the direction of  its fibers and the retropectoral space entered.  With fiberoptic  illumination, a generous retropectoral space was created, releasing the  inferior fibers and trying to stay under some of the serratus anterior  muscle laterally.  Bleeding was controlled with the electrocautery unit, and  there was noted to be good hemostasis.  At his point, 100 mL of normal  saline were added to the tissue expander, which can be expanded up to 600  mL, and placed in position with the valve system superior.  The muscle was  closed with interrupted 3-0 Vicryl.  There was some exposure, particularly  medially, of the lower aspect of the tissue expander where the muscle had  been released to position the tissue expander in a proper position.  At this  point, a 10 mm drain had been placed laterally and brought out through a  separate stab wound.  Marcaine 0.25% 30 mL were placed in the wound and at  this point  interrupted 3-0 Monocryl was used to approximate the subcutaneous  tissues, followed by running subcuticular 4-0 Monocryl with the skin wound  further reinforced by the application of Steri-Strips.  The suction was then  applied, removing the residual normal saline which had been used for  irrigation as well as the Marcaine.  A light dressing held in place by  Hypafix was applied.  The patient tolerated the procedure well and was able  to be discharged from the operating room to the recovery room, subsequently  to be admitted for overnight observation.       HH/MEDQ  D:  11/01/2004  T:  11/01/2004  Job:  [id number]

## 2010-10-25 ENCOUNTER — Encounter: Payer: Self-pay | Admitting: Internal Medicine

## 2010-12-05 ENCOUNTER — Encounter (INDEPENDENT_AMBULATORY_CARE_PROVIDER_SITE_OTHER): Payer: Self-pay

## 2010-12-22 ENCOUNTER — Ambulatory Visit (INDEPENDENT_AMBULATORY_CARE_PROVIDER_SITE_OTHER): Payer: Medicare Other | Admitting: Internal Medicine

## 2010-12-22 ENCOUNTER — Encounter: Payer: Self-pay | Admitting: Internal Medicine

## 2010-12-22 DIAGNOSIS — R7309 Other abnormal glucose: Secondary | ICD-10-CM

## 2010-12-22 DIAGNOSIS — E785 Hyperlipidemia, unspecified: Secondary | ICD-10-CM

## 2010-12-22 DIAGNOSIS — M545 Low back pain: Secondary | ICD-10-CM

## 2010-12-22 DIAGNOSIS — I10 Essential (primary) hypertension: Secondary | ICD-10-CM

## 2010-12-22 MED ORDER — PRAVASTATIN SODIUM 20 MG PO TABS: 20.0000 mg | ORAL_TABLET | Freq: Every evening | ORAL | Status: DC

## 2010-12-22 MED ORDER — TRAMADOL HCL 50 MG PO TABS: 50.0000 mg | ORAL_TABLET | Freq: Four times a day (QID) | ORAL | Status: AC | PRN

## 2010-12-22 MED ORDER — ATENOLOL 50 MG PO TABS: 50.0000 mg | ORAL_TABLET | Freq: Every day | ORAL | Status: DC

## 2010-12-22 MED ORDER — LEVOTHYROXINE SODIUM 25 MCG PO TABS: 25.0000 ug | ORAL_TABLET | Freq: Every day | ORAL | Status: DC

## 2010-12-22 MED ORDER — CYCLOBENZAPRINE HCL 5 MG PO TABS: 5.0000 mg | ORAL_TABLET | ORAL | Status: AC

## 2010-12-22 NOTE — Patient Instructions (Addendum)
Risk of premature heart attack or stroke increases as LDL or BAD cholesterol rises.Advanced cholesterol panels optimally determine risk base on particle composition ( NMR Lipoprofile ) or by assessing multiple other genetic risks(Boston Heart Panel 1304X).Based on your NMR Lipoprofile , LDL goal = < 120, ideally< 90. These are indicated when LDL is > 130, especially if there is family history of heart attack in males before 48 or women before 68.  Please  schedule fasting Labs in 10 weeks : Lipids, hepatic panel,CK (272.4, 995.20). The best exercises for the low back include freestyle swimming, stretch aerobics, and yoga.

## 2010-12-22 NOTE — Progress Notes (Signed)
Subjective:    Patient ID: Andrea Kirby, female    DOB: 1933/09/20, 77 y.o.   MRN: [id number]  HPI HYPERTENSION: Disease Monitoring: Blood pressure range-126/86 on average @ home  Chest pain, palpitations- no       Dyspnea- no Medications: Compliance- yes  Lightheadedness,Syncope- no    Edema- only lymphedema  FASTING HYPERGLYCEMIA: Disease Monitoring: Blood Sugar ranges-not monitored  Polyuria/phagia/dipsia- no      Visual problems- no; last exam 08/11, early cataract Medications: Compliance- no meds; on low carb diet  Hypoglycemic symptoms- no A1c 5.8% in 03/11([place] records reviewed)  HYPERLIPIDEMIA: Disease Monitoring: See symptoms for Hypertension Medications: Compliance- no  Abd pain, bowel changes- no   Muscle aches- no LDL 88, HDL 46, TG 78 in 06/2009 on Vytorin 10/20 mg 1/2 daily. 30 pills cost $90  Osteopenia : BMD stable in 2011; vitamin D level 42 in 06/2009            Review of Systems she describes a flare of acute low back pain recently. She's been using a muscle relaxant and a Cox 2 agent from 2009 with response.There has been no associated neurologic symptoms.     Objective:   Physical Exam Gen.: Healthy and well-nourished in appearance. Alert, appropriate and cooperative throughout exam. Head: Normocephalic without obvious abnormalities Eyes: No corneal or conjunctival inflammation noted. Neck: No deformities, masses, or tenderness noted. Range of motion &. Thyroid normal. Lungs: Normal respiratory effort; chest expands symmetrically. Lungs are clear to auscultation without rales, wheezes, or increased work of breathing. Heart: Normal rate and rhythm. Normal S1 and S2. No gallop, click, or rub. S4 w/o  murmur. Abdomen: Bowel sounds normal; abdomen soft and nontender. No masses, organomegaly or hernias noted.                                                                                  Musculoskeletal/extremities: Pain R flank lying back  & sitting up . No clubbing, cyanosis noted. Range of motion  normal .Tone & strength  Normal.Mild DJD joints . Nail health  Good. Lymphedema LUE , compression device worn Vascular: Carotid, radial artery, dorsalis pedis and  posterior tibial pulses are full and equal. No bruits present. Neurologic: Alert and oriented x3. Deep tendon reflexes symmetrical and normal.          Skin: Intact without suspicious lesions or rashes. Lymph: No cervical, axillary lymphadenopathy present. Psych: Mood and affect are normal. Normally interactive                                                                                         Assessment & Plan:  #1 hypertension , well controlled  #2 fasting hyperglycemia, A1c is 5.8% no diabetes present  #3 dyslipidemia, lipids at goal but Vytorin is costly  #4 acute low back pain responsive to  a Cox 2 agent and muscle relaxant  #5 hypothyroidism, TSH therapeutic  Plan: See orders and recommendations.

## 2010-12-25 ENCOUNTER — Other Ambulatory Visit: Payer: Self-pay | Admitting: Internal Medicine

## 2010-12-25 DIAGNOSIS — T887XXA Unspecified adverse effect of drug or medicament, initial encounter: Secondary | ICD-10-CM

## 2010-12-25 DIAGNOSIS — E785 Hyperlipidemia, unspecified: Secondary | ICD-10-CM

## 2010-12-27 ENCOUNTER — Telehealth: Payer: Self-pay

## 2010-12-27 NOTE — Telephone Encounter (Signed)
Andrea Kirby I asked  for her chart but actually I have it. She does need an ultrasound because of a left ovarian cyst. It cannot be done on the day of the visit because of Medicare.

## 2010-12-27 NOTE — Telephone Encounter (Signed)
PT HAS AEX WITH YOU 02-26-11 AND USUALLY GETS ULTRASOUND TOO EVERY YEAR. WANTS TO KNOW IF SHE IS SUPPOSED TO HAVE ONE THIS YEAR ALSO?

## 2010-12-28 NOTE — Telephone Encounter (Signed)
PER DR. Timoteo Expose NOTE I FORWARDED THIS TO DONNA & CLAUDIA TO SET UP ULTRASOUND FOR PT.

## 2011-01-25 ENCOUNTER — Other Ambulatory Visit: Payer: Self-pay | Admitting: Oncology

## 2011-01-25 ENCOUNTER — Encounter (HOSPITAL_BASED_OUTPATIENT_CLINIC_OR_DEPARTMENT_OTHER): Payer: Medicare Other | Admitting: Oncology

## 2011-01-25 DIAGNOSIS — C50919 Malignant neoplasm of unspecified site of unspecified female breast: Secondary | ICD-10-CM

## 2011-01-25 DIAGNOSIS — Z853 Personal history of malignant neoplasm of breast: Secondary | ICD-10-CM

## 2011-01-25 LAB — CBC WITH DIFFERENTIAL/PLATELET
Basophils Absolute: 0.1 10*3/uL (ref 0.0–0.1)
EOS%: 1.9 % (ref 0.0–7.0)
Eosinophils Absolute: 0.2 10*3/uL (ref 0.0–0.5)
HGB: 14.2 g/dL (ref 11.6–15.9)
MCH: 31.2 pg (ref 25.1–34.0)
NEUT#: 6.1 10*3/uL (ref 1.5–6.5)
RBC: 4.56 10*6/uL (ref 3.70–5.45)
RDW: 13.5 % (ref 11.2–14.5)
lymph#: 2.4 10*3/uL (ref 0.9–3.3)

## 2011-01-26 LAB — COMPREHENSIVE METABOLIC PANEL
ALT: 28 U/L (ref 0–35)
Alkaline Phosphatase: 69 U/L (ref 39–117)
CO2: 26 mEq/L (ref 19–32)
Creatinine, Ser: 0.87 mg/dL (ref 0.50–1.10)
Total Bilirubin: 0.4 mg/dL (ref 0.3–1.2)

## 2011-01-26 LAB — VITAMIN D 25 HYDROXY (VIT D DEFICIENCY, FRACTURES): Vit D, 25-Hydroxy: 48 ng/mL (ref 30–89)

## 2011-02-01 ENCOUNTER — Encounter (HOSPITAL_BASED_OUTPATIENT_CLINIC_OR_DEPARTMENT_OTHER): Payer: Medicare Other | Admitting: Oncology

## 2011-02-01 DIAGNOSIS — E559 Vitamin D deficiency, unspecified: Secondary | ICD-10-CM

## 2011-02-01 DIAGNOSIS — Z853 Personal history of malignant neoplasm of breast: Secondary | ICD-10-CM

## 2011-02-14 ENCOUNTER — Other Ambulatory Visit: Payer: Self-pay | Admitting: Obstetrics and Gynecology

## 2011-02-14 ENCOUNTER — Encounter: Payer: Self-pay | Admitting: Gynecology

## 2011-02-14 DIAGNOSIS — N83209 Unspecified ovarian cyst, unspecified side: Secondary | ICD-10-CM

## 2011-02-14 DIAGNOSIS — N952 Postmenopausal atrophic vaginitis: Secondary | ICD-10-CM | POA: Insufficient documentation

## 2011-02-14 DIAGNOSIS — N809 Endometriosis, unspecified: Secondary | ICD-10-CM | POA: Insufficient documentation

## 2011-02-14 DIAGNOSIS — N9489 Other specified conditions associated with female genital organs and menstrual cycle: Secondary | ICD-10-CM | POA: Insufficient documentation

## 2011-02-14 DIAGNOSIS — N870 Mild cervical dysplasia: Secondary | ICD-10-CM | POA: Insufficient documentation

## 2011-02-19 ENCOUNTER — Other Ambulatory Visit (INDEPENDENT_AMBULATORY_CARE_PROVIDER_SITE_OTHER): Payer: Medicare Other

## 2011-02-19 DIAGNOSIS — E785 Hyperlipidemia, unspecified: Secondary | ICD-10-CM

## 2011-02-19 DIAGNOSIS — T887XXA Unspecified adverse effect of drug or medicament, initial encounter: Secondary | ICD-10-CM

## 2011-02-19 LAB — LIPID PANEL
Cholesterol: 160 mg/dL (ref 0–200)
HDL: 50.7 mg/dL
LDL Cholesterol: 94 mg/dL (ref 0–99)
Total CHOL/HDL Ratio: 3
Triglycerides: 76 mg/dL (ref 0.0–149.0)
VLDL: 15.2 mg/dL (ref 0.0–40.0)

## 2011-02-19 LAB — HEPATIC FUNCTION PANEL
ALT: 30 U/L (ref 0–35)
Total Bilirubin: 0.8 mg/dL (ref 0.3–1.2)
Total Protein: 7.4 g/dL (ref 6.0–8.3)

## 2011-02-20 ENCOUNTER — Inpatient Hospital Stay: Admission: RE | Admit: 2011-02-20 | Payer: Medicare Other | Source: Ambulatory Visit

## 2011-02-20 ENCOUNTER — Ambulatory Visit: Payer: Medicare Other | Admitting: Obstetrics and Gynecology

## 2011-02-21 ENCOUNTER — Ambulatory Visit (INDEPENDENT_AMBULATORY_CARE_PROVIDER_SITE_OTHER): Payer: Medicare Other

## 2011-02-21 ENCOUNTER — Ambulatory Visit: Payer: Medicare Other | Admitting: Obstetrics and Gynecology

## 2011-02-21 ENCOUNTER — Ambulatory Visit (INDEPENDENT_AMBULATORY_CARE_PROVIDER_SITE_OTHER): Payer: Medicare Other | Admitting: Obstetrics and Gynecology

## 2011-02-21 ENCOUNTER — Other Ambulatory Visit: Payer: Medicare Other

## 2011-02-21 DIAGNOSIS — N83209 Unspecified ovarian cyst, unspecified side: Secondary | ICD-10-CM

## 2011-02-21 DIAGNOSIS — D3910 Neoplasm of uncertain behavior of unspecified ovary: Secondary | ICD-10-CM

## 2011-02-21 NOTE — Progress Notes (Signed)
Patient came back today for further followup of a peritoneal inclusion cyst versus an ovarian cyst from an ovarian remnant. We went ahead and ultrasound her. She is asymptomatic at the moment. Her uterus is surgically absent on ultrasound. Her right adnexa fails to reveal masses. Her left adnexa reveals a thin walled echo-free cyst of 3.8 cm. It is avascular. Compared to last years exam is unchanged. Her cul-de-sac is free of fluid.  Assessment: Left adnexal cyst, stable.  Plan: Patient reassured. We will repeat ultrasound in one year.

## 2011-02-26 ENCOUNTER — Ambulatory Visit (INDEPENDENT_AMBULATORY_CARE_PROVIDER_SITE_OTHER): Payer: Medicare Other | Admitting: Obstetrics and Gynecology

## 2011-02-26 ENCOUNTER — Other Ambulatory Visit (HOSPITAL_COMMUNITY)
Admission: RE | Admit: 2011-02-26 | Discharge: 2011-02-26 | Disposition: A | Discharge status: Home / Self Care | Payer: Medicare Other | Source: Ambulatory Visit | Attending: Obstetrics and Gynecology | Admitting: Obstetrics and Gynecology

## 2011-02-26 ENCOUNTER — Encounter: Payer: Self-pay | Admitting: Obstetrics and Gynecology

## 2011-02-26 VITALS — BP 132/80 | Ht 61.0 in | Wt 168.0 lb

## 2011-02-26 DIAGNOSIS — D069 Carcinoma in situ of cervix, unspecified: Secondary | ICD-10-CM

## 2011-02-26 DIAGNOSIS — Z23 Encounter for immunization: Secondary | ICD-10-CM

## 2011-02-26 DIAGNOSIS — M8580 Other specified disorders of bone density and structure, unspecified site: Secondary | ICD-10-CM

## 2011-02-26 DIAGNOSIS — Z124 Encounter for screening for malignant neoplasm of cervix: Secondary | ICD-10-CM | POA: Insufficient documentation

## 2011-02-26 DIAGNOSIS — N83209 Unspecified ovarian cyst, unspecified side: Secondary | ICD-10-CM

## 2011-02-26 DIAGNOSIS — N952 Postmenopausal atrophic vaginitis: Secondary | ICD-10-CM

## 2011-02-26 DIAGNOSIS — C50919 Malignant neoplasm of unspecified site of unspecified female breast: Secondary | ICD-10-CM

## 2011-02-26 NOTE — Progress Notes (Signed)
Subjective:     Patient ID: Andrea Kirby, female   DOB: 10/14/1933, 77 y.o.   MRN: [id number]  HPIpatient came back to see me today for further followup. She has had bilateral breast cancers with bilateral mastectomies and TRAM. She continues to have followup mammograms of her left breast where she had the TRAM. She has no evidence of disease. She does have osteopenia without an elevated FRAX risk. Her last bone density did show a significant decrease in BMD of the femoral neck. She's had no fractures and continues with calcium and vitamin D. She is having no vaginal bleeding or pelvic pain. She has atrophic vaginitis but is doing well without hormone replacement. She does have a cyst in her left adnexa which has remained stable on ultrasound and she is not having abdominal pain.   Review of Systems  Constitutional:       Hypothyroidism  HENT: Negative.   Eyes: Negative.   Respiratory: Negative.   Cardiovascular:       Hyperlipidemia, hypertension  Gastrointestinal:       Colon polyps  Genitourinary: Negative.   Musculoskeletal:       Lymphedema left arm  Skin: Negative.   Neurological: Negative.   Hematological: Negative.   Psychiatric/Behavioral: Negative.        Objective:   Physical ExamHEENT: Within normal limits. Kennon Portela present Neck: No masses. Supraclavicular lymph nodes: Not enlarged. Breasts: status post bilateral mastectomy with reconstruction. No masses Abdomen: Soft no masses guarding or rebound. No hernias. Pelvic: External within normal limits. BUS within normal limits. Vaginal examination shows poor estrogen effect, no cystocele enterocele or rectocele. Cervix and uterus absent. Adnexa within normal limits. Rectovaginal confirmatory. Extremities within normal limits.      Assessment:     #1. Bilateral breast cancer, no existing disease #2. Left adnexal mass, stable #3. Osteopenia #4. Atrophic vaginitis    Plan:     Continue yearly mammograms.  Continue yearly pelvic ultrasounds. Continue biannual bone densities.

## 2011-03-16 ENCOUNTER — Other Ambulatory Visit: Payer: Self-pay | Admitting: Internal Medicine

## 2011-03-16 DIAGNOSIS — E785 Hyperlipidemia, unspecified: Secondary | ICD-10-CM

## 2011-03-16 NOTE — Telephone Encounter (Signed)
Left message on voicemail for patient to advise if she would like rx sent to pharmacy out of town x 1 year or is she just needing a temporary supply while out of town

## 2011-03-19 NOTE — Telephone Encounter (Signed)
Patient will be back in [place] in may - she wants  1 year rx for pravastatin - needs 3 month supply of atenolol  & levolthyroxine

## 2011-03-20 MED ORDER — PRAVASTATIN SODIUM 20 MG PO TABS: 20.0000 mg | ORAL_TABLET | Freq: Every evening | ORAL | Status: DC

## 2011-03-20 MED ORDER — ATENOLOL 50 MG PO TABS: 50.0000 mg | ORAL_TABLET | Freq: Every day | ORAL | Status: DC

## 2011-03-20 MED ORDER — LEVOTHYROXINE SODIUM 25 MCG PO TABS: 25.0000 ug | ORAL_TABLET | Freq: Every day | ORAL | Status: DC

## 2011-03-20 NOTE — Telephone Encounter (Signed)
All 3 rx's called in 3 month supply with 1 refill (additional refills can be given on pravastatin when patient comes back in May)

## 2011-06-09 ENCOUNTER — Telehealth: Payer: Self-pay | Admitting: Oncology

## 2011-06-09 NOTE — Telephone Encounter (Signed)
per pts request mailed her [phone] appts to [address] [place], [place] [address]

## 2011-07-11 ENCOUNTER — Other Ambulatory Visit: Payer: Self-pay | Admitting: Internal Medicine

## 2011-09-25 ENCOUNTER — Telehealth: Payer: Self-pay | Admitting: *Deleted

## 2011-09-25 NOTE — Telephone Encounter (Signed)
patient called in and confirmed her date and time in July with labs one week before md appointment

## 2011-09-26 ENCOUNTER — Encounter: Payer: Self-pay | Admitting: Internal Medicine

## 2011-09-26 ENCOUNTER — Ambulatory Visit (INDEPENDENT_AMBULATORY_CARE_PROVIDER_SITE_OTHER): Payer: Medicare Other | Admitting: Internal Medicine

## 2011-09-26 VITALS — BP 126/80 | HR 70 | Temp 98.2°F | Resp 12 | Ht 61.08 in | Wt 169.0 lb

## 2011-09-26 DIAGNOSIS — E039 Hypothyroidism, unspecified: Secondary | ICD-10-CM

## 2011-09-26 DIAGNOSIS — I10 Essential (primary) hypertension: Secondary | ICD-10-CM

## 2011-09-26 DIAGNOSIS — E785 Hyperlipidemia, unspecified: Secondary | ICD-10-CM

## 2011-09-26 DIAGNOSIS — R7309 Other abnormal glucose: Secondary | ICD-10-CM

## 2011-09-26 DIAGNOSIS — R9431 Abnormal electrocardiogram [ECG] [EKG]: Secondary | ICD-10-CM

## 2011-09-26 DIAGNOSIS — IMO0001 Reserved for inherently not codable concepts without codable children: Secondary | ICD-10-CM

## 2011-09-26 DIAGNOSIS — M949 Disorder of cartilage, unspecified: Secondary | ICD-10-CM

## 2011-09-26 DIAGNOSIS — Z0000 Encounter for general adult medical examination without abnormal findings: Secondary | ICD-10-CM

## 2011-09-26 DIAGNOSIS — M899 Disorder of bone, unspecified: Secondary | ICD-10-CM

## 2011-09-26 DIAGNOSIS — Z853 Personal history of malignant neoplasm of breast: Secondary | ICD-10-CM

## 2011-09-26 LAB — BASIC METABOLIC PANEL
BUN: 21 mg/dL (ref 6–23)
Creatinine, Ser: 0.9 mg/dL (ref 0.4–1.2)
GFR: 67.38 mL/min (ref >60.00)

## 2011-09-26 LAB — LIPID PANEL
Total CHOL/HDL Ratio: 3
Triglycerides: 82 mg/dL (ref 0.0–149.0)

## 2011-09-26 LAB — CBC WITH DIFFERENTIAL/PLATELET
Basophils Relative: 0.4 % (ref 0.0–3.0)
Eosinophils Absolute: 0.1 10*3/uL (ref 0.0–0.7)
Lymphocytes Relative: 27.5 % (ref 12.0–46.0)
Lymphs Abs: 2.3 10*3/uL (ref 0.7–4.0)
MCV: 93.7 fl (ref 78.0–100.0)
Neutro Abs: 5.3 10*3/uL (ref 1.4–7.7)
RDW: 13.7 % (ref 11.5–14.6)
WBC: 8.2 10*3/uL (ref 4.5–10.5)

## 2011-09-26 LAB — HEPATIC FUNCTION PANEL
ALT: 31 U/L (ref 0–35)
Albumin: 3.9 g/dL (ref 3.5–5.2)
Alkaline Phosphatase: 57 U/L (ref 39–117)
Bilirubin, Direct: 0 mg/dL (ref 0.0–0.3)
Total Protein: 7.3 g/dL (ref 6.0–8.3)

## 2011-09-26 LAB — TSH: TSH: 2.6 u[IU]/mL (ref 0.35–5.50)

## 2011-09-26 NOTE — Progress Notes (Signed)
Subjective:    Patient ID: YITTA GONGAWARE, female    DOB: 29-Nov-1933, 78 y.o.   MRN: [id number]  HPI Medicare Wellness Visit:  The following psychosocial & medical history were reviewed as required by Medicare.   Social history: caffeine: occasional chocolate & 1 cup tea , alcohol:  rarely ,  tobacco use : never  & exercise : walks 30 min 3 X/ week & Zumba.   Home & personal  safety / fall risk: no issues, activities of daily living: no limitations , seatbelt use :yes , and smoke alarm employment : yes .  Power of Attorney/Living Will status : NO  Vision ( as recorded per Nurse) & Hearing  evaluation :  Ophth exam 8/12, early cataracts. See exam. Orientation :oriented X 3 , memory & recall :, spelling  testing: good,and mood & affect : normal . Depression / anxiety: denied Travel history : 9/12 [place] , immunization status :? unsure , transfusion history: no, and preventive health surveillance ( colonoscopies, BMD , etc as per protocol/ [facility]): colonoscopy up to date, Dental care:  Seen annually . Chart reviewed &  Updated. Active issues reviewed & addressed.       Review of Systems HYPERTENSION: Disease Monitoring: Blood pressure range- 115-133/75-92 over past 2 weeks  Chest pain, palpitations- no       Dyspnea- no Medications: Compliance- no  Lightheadedness,Syncope- no    Edema- no  FASTING HYPERGLYCEMIA, PMH of:  Disease Monitoring:  Polyuria/phagia/dipsia- polyuria with hesitancy, no polyphagia or polydipsia       Visual problems- no Hypoglycemic symptoms- no  HYPERLIPIDEMIA: Disease Monitoring: See symptoms for Hypertension Medications: Compliance- yes  Abd pain, bowel changes- no   Muscle aches- no but  mild burning sensation in left lateral calf for approximately one hour at times when waking up in the am for the past 6 months          Objective:   Physical Exam Gen.:  well-nourished in appearance. Alert, appropriate and cooperative throughout exam. Head:  Normocephalic without obvious abnormalities   Eyes: No corneal or conjunctival inflammation noted.  Extraocular motion intact. Vision grossly normal with lenses. Arcus senilis present. Minor pterygium OD medially Ears: Hearing aid worn only on  left. Decreased auditory acuity on the right as well. Nose: External nasal exam reveals no deformity or inflammation. Nasal mucosa are pink and moist. No lesions or exudates noted.  Mouth: Oral mucosa and oropharynx reveal no lesions or exudates. Teeth in good repair. Neck: No deformities, masses noted. Range of motion & Thyroid normal. Cervical rib suggestive clinically of left. There was some tenderness to palpation. No lymphadenopathy could be appreciated Lungs: Normal respiratory effort; chest expands symmetrically. Lungs are clear to auscultation without rales, wheezes, or increased work of breathing. Heart: Normal rate and rhythm. Normal S1 and S2. No gallop, click, or rub. S4 w/o murmur. Abdomen: Bowel sounds normal; abdomen soft and nontender. No masses, organomegaly or hernias noted. Genitalia: Dr Eda Paschal                                                              Musculoskeletal/extremities: Minor lordosis noted of  the thoracic  spine. No clubbing, cyanosis,pedal edema noted. Lymphedema left upper extremity. Range of motion  normal .Tone & strength  normal.Joints :  Osteoarthritic finger changes. Nail health  good. Vascular: Carotid, radial artery, dorsalis pedis and  posterior tibial pulses are full and equal. No bruits present. Neurologic: Alert and oriented x3. Deep tendon reflexes symmetrical but 0-1/2+.         Skin: Intact without suspicious lesions or rashes. Lymph: No cervical, axillary lymphadenopathy present. Psych: Mood and affect are normal. Normally interactive                                                                                         Assessment & Plan:  #1 Medicare Wellness Exam; criteria met ; data  entered #2 Problem List reviewed ; Assessment/ Recommendations made  #3 positional neuropathy left lower extremity, L5 distribution . If symptoms persist or progress; gabapentin can be considered as well as imaging Plan: see Orders

## 2011-09-26 NOTE — Patient Instructions (Signed)
Preventive Health Care: Exercise  30-45  minutes a day, 3-4 days a week. Walking is especially valuable in preventing Osteoporosis. Eat a low-fat diet with lots of fruits and vegetables, up to 7-9 servings per day. Consume less than 30 grams of sugar per day from foods & drinks with High Fructose Corn Syrup as # 1,2,3 or #4 on label. Health Care Power of Attorney & Living Will place you in charge of your health care  decisions. Verify these are  in place. Please try to go on My Chart within the next 24 hours to allow me to release the results directly to you.  

## 2011-09-27 LAB — VITAMIN D 25 HYDROXY (VIT D DEFICIENCY, FRACTURES): Vit D, 25-Hydroxy: 47 ng/mL (ref 30–89)

## 2011-10-14 ENCOUNTER — Other Ambulatory Visit: Payer: Self-pay | Admitting: Internal Medicine

## 2011-10-15 ENCOUNTER — Other Ambulatory Visit: Payer: Self-pay | Admitting: Internal Medicine

## 2011-10-15 MED ORDER — LEVOTHYROXINE SODIUM 25 MCG PO TABS: 25.0000 ug | ORAL_TABLET | Freq: Every day | ORAL | Status: DC

## 2011-10-15 MED ORDER — ATENOLOL 50 MG PO TABS: ORAL_TABLET | ORAL | Status: DC

## 2011-10-15 NOTE — Telephone Encounter (Signed)
Rx sent 

## 2011-10-15 NOTE — Telephone Encounter (Signed)
Refills x 2 Last ov 5.29.13  1-levothyroxine tablet #30, take one tablet by mouth daily  Last fill 5.17.13  2-atenolol 50mg  #30, 1q day  last fill 3.13.13

## 2011-10-29 ENCOUNTER — Other Ambulatory Visit: Payer: Medicare Other | Admitting: Lab

## 2011-10-29 LAB — CBC WITH DIFFERENTIAL/PLATELET
BASO%: 0.6 % (ref 0.0–2.0)
LYMPH%: 27.8 % (ref 14.0–49.7)
MCHC: 33.4 g/dL (ref 31.5–36.0)
MONO#: 0.6 10*3/uL (ref 0.1–0.9)
Platelets: 206 10*3/uL (ref 145–400)
RBC: 4.62 10*6/uL (ref 3.70–5.45)
RDW: 13.7 % (ref 11.2–14.5)
WBC: 8.6 10*3/uL (ref 3.9–10.3)
lymph#: 2.4 10*3/uL (ref 0.9–3.3)

## 2011-10-30 LAB — COMPREHENSIVE METABOLIC PANEL
ALT: 25 U/L (ref 0–35)
Alkaline Phosphatase: 57 U/L (ref 39–117)
CO2: 26 mEq/L (ref 19–32)
Sodium: 139 mEq/L (ref 135–145)
Total Bilirubin: 0.7 mg/dL (ref 0.3–1.2)
Total Protein: 6.8 g/dL (ref 6.0–8.3)

## 2011-10-30 LAB — VITAMIN D 25 HYDROXY (VIT D DEFICIENCY, FRACTURES): Vit D, 25-Hydroxy: 40 ng/mL (ref 30–89)

## 2011-11-05 ENCOUNTER — Ambulatory Visit (HOSPITAL_BASED_OUTPATIENT_CLINIC_OR_DEPARTMENT_OTHER): Payer: Medicare Other | Admitting: Oncology

## 2011-11-05 ENCOUNTER — Telehealth: Payer: Self-pay | Admitting: Oncology

## 2011-11-05 VITALS — BP 127/86 | HR 73 | Temp 98.4°F | Ht 61.0 in | Wt 171.1 lb

## 2011-11-05 DIAGNOSIS — Z853 Personal history of malignant neoplasm of breast: Secondary | ICD-10-CM

## 2011-11-05 DIAGNOSIS — Z170 Estrogen receptor positive status [ER+]: Secondary | ICD-10-CM

## 2011-11-05 DIAGNOSIS — C50919 Malignant neoplasm of unspecified site of unspecified female breast: Secondary | ICD-10-CM

## 2011-11-05 DIAGNOSIS — Z9010 Acquired absence of unspecified breast and nipple: Secondary | ICD-10-CM

## 2011-11-05 NOTE — Progress Notes (Signed)
ID: Andrea Kirby   DOB: [birth year]  MR#: [id number]  CSN#:[id number]  INTERVAL HISTORY: Andrea Kirby returns for follow up of her breast cancer. The interval history is generally stable. Her husband Andrea Kirby had a DVT 06/2011. He is doing well.  REVIEW OF SYSTEMS: She's had 2 episodes of chest pressure associated with some shortness of breath, but relieved by burping. She has some pain in her right ear and she wanted me to look there. She has some joint aches and pains which are not more intense or persistent than prior. Just some urinary dribbling. She feels forgetful. She keeps a dry cough, but no pleurisy or hemoptysis, no fever, and no purulent sputum. She has cataracts. Otherwise a detailed review of systems today was noncontributory   PAST MEDICAL HISTORY: Past Medical History  Diagnosis Date  . Hyperlipidemia   . Hypertension   . Thyroid disease   . CIN I (cervical intraepithelial neoplasia I)   . Endometriosis     Dr Eda Paschal  . Atrophic vaginitis   . Adnexal mass     Left  . Cancer     Breast cancer-Rt. DCIS-Left stage 3 invasive    PAST SURGICAL HISTORY: Past Surgical History  Procedure Date  . Mastectomy 1998 & 2006    Breast cancer R and L breast  . Bilateral salpingoophorectomy     endometrosis  . Colonoscopy w/ polypectomy     no polyp 2008  . Abdominal hysterectomy 1982    BSO  . Breast surgery 3086,5784    Mastectomy X 2  . Breast surgery     Reconstruction  . Cesarean section   . Oophorectomy 1982    BSO  . Colposcopy   . Appendectomy     FAMILY HISTORY Family History  Problem Relation Age of Onset  . Heart disease Mother     CHF  . Diabetes Mother   . Hypertension Mother   . Cancer Father     lung cancer  . Diabetes Sister   . Hypertension Sister   . Emphysema Sister   . Cancer Brother     metastatic rectal cancer  . Hypertension Brother   . Aneurysm Brother     abdominal aneursym  . Stroke Paternal Aunt     X3 ; in 31s  . Stroke Paternal  Uncle     in 59s  . Stroke Paternal Grandmother     in 23s    GYNECOLOGIC HISTORY: GX P1.  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 Her husband also was a professor. Their son, Andrea Kirby, lives and works in [place]. They have no grandchildren. 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   ADVANCED DIRECTIVES:  HEALTH MAINTENANCE: History  Substance Use Topics  . Smoking status: Never Smoker   . Smokeless tobacco: Not on file  . Alcohol Use: Yes     very rarely     Colonoscopy:  PAP:  Bone density:  Lipid panel:  Allergies  Allergen Reactions  . Neomycin     rash  . Prednisone   . Sulfonamide Derivatives     ? reaction    Current Outpatient Prescriptions  Medication Sig Dispense Refill  . atenolol (TENORMIN) 50 MG tablet TAKE 1 TABLET BY MOUTH ONCE DAILY  90 tablet  1  . Calcium Citrate-Vitamin D 200-250 MG-UNIT TABS Take by mouth daily.        Marland Kitchen levothyroxine (SYNTHROID) 25 MCG tablet Take 1 tablet (25 mcg total) by mouth daily.  90  tablet  1  . Multiple Vitamin (MULTIVITAMIN) tablet Take 1 tablet by mouth daily.        . Omega-3 Fatty Acids (FISH OIL) 1200 MG CAPS Take by mouth daily.        . pravastatin (PRAVACHOL) 20 MG tablet take 1 tablet by mouth once daily  90 tablet  1  . cyclobenzaprine (FLEXERIL) 5 MG tablet Take 1 tablet (5 mg total) by mouth as directed. 1-2 qhs prn  20 tablet  0  . traMADol (ULTRAM) 50 MG tablet Take 1 tablet (50 mg total) by mouth every 6 (six) hours as needed for pain.  30 tablet  0    OBJECTIVE: Middle-aged white woman who appears comfortable Filed Vitals:   11/05/11 1155  BP: 127/86  Pulse: 73  Temp: 98.4 F (36.9 C)     Body mass index is 32.33 kg/(m^2).    ECOG FS: 1  Sclerae unicteric Oropharynx clear; the right eardrum shows a slightly reddened area, with no ulceration or evidence of the right and. The tympanic membrane itself is pearly with normal light reflex. No cervical or supraclavicular  adenopathy Lungs no rales or rhonchi Heart regular rate and rhythm, no murmur appreciated Abd soft, nontender, positive bowel sounds, no organomegaly. MSK no focal spinal tenderness, no peripheral edema Neuro: nonfocal Breasts: The right breast is status post mastectomy with implant reconstruction. There is no suspicious finding. The left breast is status post mastectomy with TRAM reconstruction. Again there is no evidence of recurrence.  LAB RESULTS: Lab Results  Component Value Date   WBC 8.6 10/29/2011   NEUTROABS 5.4 10/29/2011   HGB 14.3 10/29/2011   HCT 42.9 10/29/2011   MCV 92.8 10/29/2011   PLT 206 10/29/2011      Chemistry      Component Value Date/Time   NA 139 10/29/2011 1402   K 4.3 10/29/2011 1402   CL 104 10/29/2011 1402   CO2 26 10/29/2011 1402   BUN 21 10/29/2011 1402   CREATININE 0.91 10/29/2011 1402      Component Value Date/Time   CALCIUM 9.7 10/29/2011 1402   ALKPHOS 57 10/29/2011 1402   AST 24 10/29/2011 1402   ALT 25 10/29/2011 1402   BILITOT 0.7 10/29/2011 1402       Lab Results  Component Value Date   LABCA2 12 02/08/2009    No components found with this basename: [id number]    No results found for this basename: INR:1;PROTIME:1 in the last 168 hours  Urinalysis    Component Value Date/Time   COLORURINE LT. YELLOW 12/08/2008 1009   APPEARANCEUR CLEAR 12/08/2008 1009   LABSPEC 1.020 12/08/2008 1009   PHURINE 6.5 12/08/2008 1009   BILIRUBINUR NEGATIVE 12/08/2008 1009   KETONESUR NEGATIVE 12/08/2008 1009   UROBILINOGEN 0.2 12/08/2008 1009   NITRITE NEGATIVE 12/08/2008 1009   LEUKOCYTESUR TRACE 12/08/2008 1009    STUDIES: Mammogram (Left) at [facility] June 2013 unremarkable  ASSESSMENT: 78 y.o. [place] woman status post:  (1) Left modified radical mastectomy with TRAM reconstruction July 1998 for a 5.5 cm invasive ductal carcinoma involving 1 out of 14 lymph nodes, estrogen and progesterone receptor negative, treated with doxorubicin x4, paclitaxel x4, then CMF x4, then  radiation.  All treatment was completed July 1999.  (2) Right mastectomy and sentinel lymph node dissection with implant reconstruction July 2006 for ductal carcinoma in situ.  This tumor was estrogen receptor positive, PR negative.   PLAN: There is no evidence of local recurrence and her labwork  is also very reassuring. I think the chest pressure she has experienced is due to gas, since it is relieved by burping. She had a neck electrocardiogram at Dr. Alwyn Ren is which we reviewed today and certainly was not suspicious. She will let us know if this happens again. Otherwise she will see me again in one year, shortly after her next left mammogram. She knows to call for any problems that may develop before then.   Yisroel Mullendore C    11/05/2011

## 2011-11-05 NOTE — Telephone Encounter (Signed)
gve the pt her July 2014 appt calendar °

## 2012-01-03 ENCOUNTER — Telehealth: Payer: Self-pay

## 2012-01-03 NOTE — Telephone Encounter (Signed)
The TSH was therapeutic or ideal with a value of 2.60 in May. It should not have changed significantly since that time but this can be rechecked. Coenzyme Q 10 may be of benefit for the memory issues.Stopping the statin would increase cardiovascular risk of heart attack or stroke. Pravastatin is one of  the weakest of the statins & it would have less risk for memory issues than lipophilic agents such as Lipitor or simvastatin.

## 2012-01-03 NOTE — Telephone Encounter (Signed)
1.) Patient with hair loss and questions if this is related to her Thyroid.   2.) Patient states her memory is not as sharpe as it use to be and thinks it is related to Pravastatin.  Patient would like Dr.Hopper's advice on these concerns

## 2012-01-04 ENCOUNTER — Encounter: Payer: Self-pay | Admitting: Internal Medicine

## 2012-01-04 ENCOUNTER — Ambulatory Visit (INDEPENDENT_AMBULATORY_CARE_PROVIDER_SITE_OTHER): Payer: Medicare Other | Admitting: Internal Medicine

## 2012-01-04 VITALS — BP 124/80 | HR 79 | Temp 97.8°F | Wt 169.0 lb

## 2012-01-04 DIAGNOSIS — R4182 Altered mental status, unspecified: Secondary | ICD-10-CM

## 2012-01-04 DIAGNOSIS — M545 Low back pain: Secondary | ICD-10-CM

## 2012-01-04 DIAGNOSIS — L659 Nonscarring hair loss, unspecified: Secondary | ICD-10-CM

## 2012-01-04 DIAGNOSIS — G8929 Other chronic pain: Secondary | ICD-10-CM

## 2012-01-04 MED ORDER — TRAMADOL HCL 50 MG PO TABS: 50.0000 mg | ORAL_TABLET | Freq: Three times a day (TID) | ORAL | Status: AC | PRN

## 2012-01-04 MED ORDER — CYCLOBENZAPRINE HCL 5 MG PO TABS: ORAL_TABLET | ORAL | Status: AC

## 2012-01-04 NOTE — Telephone Encounter (Signed)
Discuss with patient, appt scheduled to discuss hair loss issue along with some other concerns with back.

## 2012-01-04 NOTE — Patient Instructions (Addendum)
Consider Coenzyme Q10 for  the mental status changes about which you are concerned. Review and correct the record as indicated. Please share record with all medical staff seen.   If you choose to take the pravastatin every other day; recheck fasting cholesterol values after 10 weeks.PLEASE BRING THESE INSTRUCTIONS TO FOLLOW UP  LAB APPOINTMENT.This will guarantee correct labs are drawn, eliminating need for repeat blood sampling ( needle sticks ! ). Diagnoses /Codes:272.4

## 2012-01-04 NOTE — Progress Notes (Signed)
Subjective:    Patient ID: Andrea Kirby, female    DOB: [birth year], 78 y.o.   MRN: [id number]  HPI She has noted progressive alopecia over the last 4 months; she denies the use of any new topical chemicals or treatments on her hair. Her TSH was therapeutic @ 2.60 on 09/26/11.  She's concerned about some confusion and its relationship to pravastatin. She has found herself going in the wrong direction rather than to the exit on occasion in large stores.  She has some acute right lumbosacral area pain; this is actually an exacerbation of her chronic problem which usually appears 2 days after lifting. There is no associated numbness, tingling, or weakness in extremities. Her pain is worse when she tries to stand up from a chair. It has been helped by using a bar to stretch    Review of Systems.She denies dysuria, hematuria, or pyuria. She's had no incontinence of urine or stool     Objective:   Physical Exam Gen.:  well-nourished in appearance. Alert, appropriate and cooperative throughout exam. Head: Normocephalic without obvious abnormalities; the alopecia is diffuse with very fine hair. No scalp dermatitis is suggested  Eyes: No corneal or conjunctival inflammation noted.   Extraocular motion intact. Slight lid lag & vertical nystagmus  Neck: No deformities, masses, or tenderness noted. Range of motion & Thyroid normal. Lungs: Normal respiratory effort; chest expands symmetrically. Lungs are clear to auscultation without rales, wheezes, or increased work of breathing. Heart: Normal rate and rhythm. Normal S1 and S2. No gallop, click, or rub. S4 w/o murmur. Abdomen: Bowel sounds normal; abdomen soft and nontender. No masses, organomegaly or hernias noted.                                                                    Musculoskeletal/extremities: No deformity or scoliosis noted of  the thoracic or lumbar spine. No clubbing, cyanosis,  or deformity noted. Range of motion  normal .Tone &  strength  normal.Joints normal. Nail health  good. She can lie back and sit up from the exam table but does move slowly and tends to "crawl" up & down. She has lymphedema of the left upper extremity and is wearing a compression sleeve. Straight leg raising is negative bilaterally Vascular: Carotid, radial artery, dorsalis pedis and  posterior tibial pulses are full and equal. No bruits present. Neurologic: Alert and oriented x3. Deep tendon reflexes symmetrical and normal. Gait is normal to include tiptoe and heel walking.  MMSE: 30 of 30        Skin: Intact without suspicious lesions or rashes. Lymph: No cervical, axillary lymphadenopathy present. Psych: Mood and affect are normal. Normally interactive.                                                                                         Assessment & Plan:  #1 alopecia #2 ? Mental status changes,  Not documented on MMSE #3 chronic LBP Plan: See orders and recommendations

## 2012-02-12 ENCOUNTER — Other Ambulatory Visit: Payer: Self-pay | Admitting: Obstetrics and Gynecology

## 2012-02-12 DIAGNOSIS — R1900 Intra-abdominal and pelvic swelling, mass and lump, unspecified site: Secondary | ICD-10-CM

## 2012-02-12 DIAGNOSIS — N83209 Unspecified ovarian cyst, unspecified side: Secondary | ICD-10-CM

## 2012-02-14 ENCOUNTER — Ambulatory Visit (INDEPENDENT_AMBULATORY_CARE_PROVIDER_SITE_OTHER): Payer: Medicare Other | Admitting: Obstetrics and Gynecology

## 2012-02-14 ENCOUNTER — Ambulatory Visit (INDEPENDENT_AMBULATORY_CARE_PROVIDER_SITE_OTHER): Payer: Medicare Other

## 2012-02-14 DIAGNOSIS — N83209 Unspecified ovarian cyst, unspecified side: Secondary | ICD-10-CM

## 2012-02-14 DIAGNOSIS — R1900 Intra-abdominal and pelvic swelling, mass and lump, unspecified site: Secondary | ICD-10-CM

## 2012-02-14 DIAGNOSIS — N9489 Other specified conditions associated with female genital organs and menstrual cycle: Secondary | ICD-10-CM

## 2012-02-14 DIAGNOSIS — N949 Unspecified condition associated with female genital organs and menstrual cycle: Secondary | ICD-10-CM

## 2012-02-14 NOTE — Progress Notes (Signed)
Patient came back today for followup ultrasound of an adnexal cyst after bilateral salpingo-oophorectomy. The differential includes a peritoneal inclusion cyst or an ovarian remnant. On ultrasound today her uterus is surgically absent. Her right adnexa is negative without an ovary. In the left adnexa there was an echo-free thin walled cyst of 3.7 cm. It is unchanged from last year in appearance and size. We have been aware of a cyst since 2004. At that time it was 2.4 cm. It has slowly grown over not since 2007. There is no evidence of ovarian remnant.  The patient was reassured. We discussed not doing ultrasound yearly. She returns this month at the end and we will rediscuss when we do her annual exam.

## 2012-02-15 ENCOUNTER — Other Ambulatory Visit: Payer: Self-pay | Admitting: Pulmonary Disease

## 2012-02-15 ENCOUNTER — Other Ambulatory Visit: Payer: Self-pay | Admitting: Internal Medicine

## 2012-02-19 ENCOUNTER — Encounter: Payer: Medicare Other | Admitting: Obstetrics and Gynecology

## 2012-02-20 ENCOUNTER — Encounter: Payer: Self-pay | Admitting: Obstetrics and Gynecology

## 2012-02-22 ENCOUNTER — Telehealth: Payer: Self-pay

## 2012-02-22 DIAGNOSIS — E785 Hyperlipidemia, unspecified: Secondary | ICD-10-CM

## 2012-02-22 NOTE — Telephone Encounter (Signed)
Pt states Alwyn Ren was talking to her about having her cholesterol checked, pt asked does she need to have it checked this week because pt is leaving November 3rd and won't be back until May. Pt want you to call her back.  PLz advise    MW

## 2012-02-25 ENCOUNTER — Other Ambulatory Visit (INDEPENDENT_AMBULATORY_CARE_PROVIDER_SITE_OTHER): Payer: Medicare Other

## 2012-02-25 DIAGNOSIS — E785 Hyperlipidemia, unspecified: Secondary | ICD-10-CM

## 2012-02-25 LAB — LIPID PANEL: Total CHOL/HDL Ratio: 4

## 2012-02-25 NOTE — Telephone Encounter (Signed)
I spoke with patient, last cholesterol check was May 2013. Patient stated she changed from taking medication daily to every other day Order placed for [facility]

## 2012-02-27 ENCOUNTER — Encounter: Payer: Self-pay | Admitting: Obstetrics and Gynecology

## 2012-02-27 ENCOUNTER — Ambulatory Visit (INDEPENDENT_AMBULATORY_CARE_PROVIDER_SITE_OTHER): Payer: Medicare Other | Admitting: Obstetrics and Gynecology

## 2012-02-27 VITALS — BP 124/80 | Ht 61.0 in | Wt 167.0 lb

## 2012-02-27 DIAGNOSIS — E079 Disorder of thyroid, unspecified: Secondary | ICD-10-CM | POA: Insufficient documentation

## 2012-02-27 DIAGNOSIS — N870 Mild cervical dysplasia: Secondary | ICD-10-CM

## 2012-02-27 DIAGNOSIS — Z23 Encounter for immunization: Secondary | ICD-10-CM

## 2012-02-27 DIAGNOSIS — I10 Essential (primary) hypertension: Secondary | ICD-10-CM | POA: Insufficient documentation

## 2012-02-27 DIAGNOSIS — L309 Dermatitis, unspecified: Secondary | ICD-10-CM

## 2012-02-27 DIAGNOSIS — L259 Unspecified contact dermatitis, unspecified cause: Secondary | ICD-10-CM

## 2012-02-27 DIAGNOSIS — C50911 Malignant neoplasm of unspecified site of right female breast: Secondary | ICD-10-CM | POA: Insufficient documentation

## 2012-02-27 DIAGNOSIS — N9489 Other specified conditions associated with female genital organs and menstrual cycle: Secondary | ICD-10-CM

## 2012-02-27 DIAGNOSIS — N952 Postmenopausal atrophic vaginitis: Secondary | ICD-10-CM

## 2012-02-27 DIAGNOSIS — C50919 Malignant neoplasm of unspecified site of unspecified female breast: Secondary | ICD-10-CM

## 2012-02-27 MED ORDER — BETAMETHASONE DIPROPIONATE AUG 0.05 % EX OINT: TOPICAL_OINTMENT | Freq: Two times a day (BID) | CUTANEOUS | Status: DC

## 2012-02-27 NOTE — Patient Instructions (Signed)
Scheduled bone density

## 2012-02-27 NOTE — Progress Notes (Signed)
Patient came to see me today for further followup. We have previously diagnosed her with breast cancer. She had an aggressive breast cancer and was treated with bilateral mastectomy and reconstruction. She continues to see an oncologist. She still has yearly mammograms and it was normal this year. We have now watched her with an adnexal mass which was originally picked up on CT scan. She had had a TAH, BSO in 1982 for endometriosis. We have watched this adnexal cyst since 2004. It is on the left side. Her diagnoses is a   a peritoneal inclusion cyst. From 2004 until the present it changed from 2.4 cm to 3.7 cm but has been stable now for 6 years. The patient is asymptomatic. It is thin walled, avascular, an echo free. The patient has osteopenia on bone density which is stable without an elevated fracture risk. She has not had any fractures. She has a skin rash on her lower mid back for the past 5 days which itches. She is having no vaginal bleeding. She is having no pelvic pain.Greater than 30 years ago patient had cervical dysplasia and was treated by Dr. Laural Benes with conization. She has had normal Pap smears since then.Her last Pap was 2012.  ROS: 12 system review done. Pertinent positives above. Other positives include hypothyroidism, hypertension, hyperlipidemia, lymphedema of left arm.  HEENT: Within normal limits.Kennon Portela present. Neck: No masses. Supraclavicular lymph nodes: Not enlarged. Breasts: Examined in both sitting and lying position. Symmetrical without skin changes or masses. Abdomen: Soft no masses guarding or rebound. No hernias. Pelvic: External within normal limits. BUS within normal limits. Vaginal examination shows poor  estrogen effect, no cystocele enterocele or rectocele. Cervix and uterus absent. Adnexa within normal limits. Rectovaginal confirmatory. Extremities within normal limits. Skin: Near coccyx there is an erythematous slightly raised rash. It appears to be contact  dermatitis. It does have a slight resemblance to herpes zoster.  Assessment: #1. Peritoneal inclusion cyst #2. Breast cancer #3. Atrophic vaginitis #4. Osteopenia #5. Contact dermatitis #6. Previous cervical dysplasia.  Plan: Patient treated with Depolene cream. Under allergies it says the patient is allergic to prednisone but on careful questioning I don't believe she is. She's had no reaction to oral medication except sulfa. She is allergic to neomycin. She will be careful when she applies it. If rash  Spreads She was seen an internist immediately to be sure it is not shingles. She will continue yearly mammograms. She will continue periodic bone densities. Pap not done.The new Pap smear guidelines were discussed with the patient. Since she has had a TAH/BSO and sees an oncologist we will just see her in 2 years Unless she's having a problem. We agreed not to continue yearly ultrasound.

## 2012-02-27 NOTE — Addendum Note (Signed)
Addended by: Dayna Barker on: 02/27/2012 03:07 PM   Modules accepted: Orders

## 2012-10-07 ENCOUNTER — Other Ambulatory Visit: Payer: Self-pay

## 2012-10-07 MED ORDER — LEVOTHYROXINE SODIUM 25 MCG PO TABS: 25.0000 ug | ORAL_TABLET | Freq: Every day | ORAL | Status: DC

## 2012-10-07 MED ORDER — ATENOLOL 50 MG PO TABS: ORAL_TABLET | ORAL | Status: DC

## 2012-10-30 ENCOUNTER — Other Ambulatory Visit (HOSPITAL_BASED_OUTPATIENT_CLINIC_OR_DEPARTMENT_OTHER): Payer: Medicare Other | Admitting: Lab

## 2012-10-30 DIAGNOSIS — Z853 Personal history of malignant neoplasm of breast: Secondary | ICD-10-CM

## 2012-10-30 DIAGNOSIS — C50919 Malignant neoplasm of unspecified site of unspecified female breast: Secondary | ICD-10-CM

## 2012-10-30 LAB — CBC WITH DIFFERENTIAL/PLATELET
Basophils Absolute: 0.1 10*3/uL (ref 0.0–0.1)
EOS%: 1.4 % (ref 0.0–7.0)
HCT: 42.8 % (ref 34.8–46.6)
HGB: 14.6 g/dL (ref 11.6–15.9)
MCH: 31.2 pg (ref 25.1–34.0)
MCV: 91.7 fL (ref 79.5–101.0)
NEUT%: 62.5 % (ref 38.4–76.8)
lymph#: 2.7 10*3/uL (ref 0.9–3.3)

## 2012-11-06 ENCOUNTER — Ambulatory Visit (HOSPITAL_COMMUNITY)
Admission: RE | Admit: 2012-11-06 | Discharge: 2012-11-06 | Disposition: A | Discharge status: Home / Self Care | Payer: Medicare Other | Source: Ambulatory Visit | Attending: Oncology | Admitting: Oncology

## 2012-11-06 ENCOUNTER — Ambulatory Visit (HOSPITAL_BASED_OUTPATIENT_CLINIC_OR_DEPARTMENT_OTHER): Payer: Medicare Other | Admitting: Oncology

## 2012-11-06 VITALS — BP 144/86 | HR 73 | Temp 97.7°F | Resp 20 | Ht 61.0 in | Wt 168.8 lb

## 2012-11-06 DIAGNOSIS — Z853 Personal history of malignant neoplasm of breast: Secondary | ICD-10-CM

## 2012-11-06 DIAGNOSIS — L0390 Cellulitis, unspecified: Secondary | ICD-10-CM

## 2012-11-06 DIAGNOSIS — M4180 Other forms of scoliosis, site unspecified: Secondary | ICD-10-CM | POA: Insufficient documentation

## 2012-11-06 DIAGNOSIS — Z9010 Acquired absence of unspecified breast and nipple: Secondary | ICD-10-CM | POA: Insufficient documentation

## 2012-11-06 DIAGNOSIS — I10 Essential (primary) hypertension: Secondary | ICD-10-CM | POA: Insufficient documentation

## 2012-11-06 DIAGNOSIS — C50919 Malignant neoplasm of unspecified site of unspecified female breast: Secondary | ICD-10-CM | POA: Insufficient documentation

## 2012-11-06 MED ORDER — CEPHALEXIN 500 MG PO CAPS: 500.0000 mg | ORAL_CAPSULE | Freq: Two times a day (BID) | ORAL | Status: DC

## 2012-11-10 ENCOUNTER — Other Ambulatory Visit: Payer: Self-pay | Admitting: Oncology

## 2012-11-10 NOTE — Progress Notes (Signed)
ID: Andrea Kirby   DOB: [birth year]  MR#: [id number]  CSN#:[id number]  INTERVAL HISTORY: Andrea Kirby returns for follow up of her breast cancer. The interval history is unremarkable. She continues to spend the summers in this area, and he winters in [place]. However they are considering selling their house here.   REVIEW OF SYSTEMS: She continues to have low back pain which she describes as achy and throbbing, but intermittent. It is not more persistent or intense than prior. Sometimes her feet swell and she can have some palpitations associated with stress. She keeps a dry cough. She has mild postnasal drip. She has hemorrhoidal bleeding at times. Sometimes her left breast hurts sometimes both breasts are sometimes neither breast or. She feels forgetful anxious and depressed although depression is not apparent in today's interactions. She complains of hot flashes. A detailed review of systems is otherwise stable  PAST MEDICAL HISTORY: Past Medical History  Diagnosis Date  . Hyperlipidemia   . CIN I (cervical intraepithelial neoplasia I)   . Atrophic vaginitis   . Adnexal mass     Left  . Hypertension   . Thyroid disease   . Cancer     Breast cancer-Rt. DCIS-Left stage 3 invasive  . Endometriosis     Dr Eda Paschal    PAST SURGICAL HISTORY: Past Surgical History  Procedure Laterality Date  . Mastectomy  1998 & 2006    Breast cancer R and L breast  . Bilateral salpingoophorectomy      endometrosis  . Colonoscopy w/ polypectomy      no polyp 2008  . Abdominal hysterectomy  1982    BSO  . Breast surgery  [id number]    Mastectomy X 2  . Breast surgery      Reconstruction  . Cesarean section    . Oophorectomy  1982    BSO  . Colposcopy    . Appendectomy      FAMILY HISTORY Family History  Problem Relation Age of Onset  . Heart disease Mother     CHF  . Diabetes Mother   . Hypertension Mother   . Cancer Father     lung cancer  . Diabetes Sister   . Hypertension Sister   .  Emphysema Sister   . Cancer Brother     metastatic rectal cancer  . Hypertension Brother   . Aneurysm Brother     abdominal aneursym  . Stroke Paternal Aunt     X3 ; in 86s  . Stroke Paternal Uncle     in 31s  . Stroke Paternal Grandmother     in 67s  . Cancer Paternal Aunt     Stomach cancer    GYNECOLOGIC HISTORY: GX P1.  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 Her husband also was a professor. Their son, Andrea Kirby, lives and works in [place]. They have no grandchildren. 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   ADVANCED DIRECTIVES:  HEALTH MAINTENANCE: History  Substance Use Topics  . Smoking status: Never Smoker   . Smokeless tobacco: Not on file  . Alcohol Use: Yes     Comment: very rarely     Colonoscopy:  PAP:  Bone density:  Lipid panel:  Allergies  Allergen Reactions  . Neomycin     rash  . Prednisone   . Sulfonamide Derivatives     ? reaction    Current Outpatient Prescriptions  Medication Sig Dispense Refill  . atenolol (TENORMIN) 50 MG tablet TAKE 1 TABLET BY  MOUTH ONCE DAILY  90 tablet  0  . augmented betamethasone dipropionate (DIPROLENE) 0.05 % ointment Apply topically 2 (two) times daily.  30 g  0  . BIOTIN PO Take by mouth.      . Calcium Citrate-Vitamin D 200-250 MG-UNIT TABS Take by mouth daily.        . cephALEXin (KEFLEX) 500 MG capsule Take 1 capsule (500 mg total) by mouth 2 (two) times daily.  30 capsule  1  . levothyroxine (SYNTHROID) 25 MCG tablet Take 1 tablet (25 mcg total) by mouth daily.  90 tablet  0  . Multiple Vitamin (MULTIVITAMIN) tablet Take 1 tablet by mouth daily.        . Omega-3 Fatty Acids (FISH OIL) 1200 MG CAPS Take by mouth daily.        . pravastatin (PRAVACHOL) 20 MG tablet take 1 tablet by mouth once daily  90 tablet  1   No current facility-administered medications for this visit.    OBJECTIVE: Middle-aged white woman in no acute distress Filed Vitals:   11/06/12 1355  BP: 144/86  Pulse:  73  Temp: 97.7 F (36.5 C)  Resp: 20     Body mass index is 31.91 kg/(m^2).    ECOG FS: 1  Sclerae unicteric Oropharynx clear; No cervical or supraclavicular adenopathy Lungs no rales or rhonchi Heart regular rate and rhythm, no murmur appreciated Abd soft, nontender, positive bowel sounds, no organomegaly. MSK no focal spinal tenderness including palpation of the lower spine. no peripheral edema Neuro: nonfocal, well-oriented, upper. Affect Breasts: The right breast is status post mastectomy with implant reconstruction. There is no suspicious finding. The left breast is status post mastectomy with TRAM reconstruction. Again there is no evidence of recurrence. Both axillae are benign  LAB RESULTS: Lab Results  Component Value Date   WBC 9.4 10/30/2012   NEUTROABS 5.9 10/30/2012   HGB 14.6 10/30/2012   HCT 42.8 10/30/2012   MCV 91.7 10/30/2012   PLT 213 10/30/2012      Chemistry      Component Value Date/Time   NA 139 10/29/2011 1402   K 4.3 10/29/2011 1402   CL 104 10/29/2011 1402   CO2 26 10/29/2011 1402   BUN 21 10/29/2011 1402   CREATININE 0.91 10/29/2011 1402      Component Value Date/Time   CALCIUM 9.7 10/29/2011 1402   ALKPHOS 57 10/29/2011 1402   AST 24 10/29/2011 1402   ALT 25 10/29/2011 1402   BILITOT 0.7 10/29/2011 1402       Lab Results  Component Value Date   LABCA2 12 02/08/2009    No components found with this basename: [id number]    No results found for this basename: INR,  in the last 168 hours  Urinalysis    Component Value Date/Time   COLORURINE LT. YELLOW 12/08/2008 1009   APPEARANCEUR CLEAR 12/08/2008 1009   LABSPEC 1.020 12/08/2008 1009   PHURINE 6.5 12/08/2008 1009   BILIRUBINUR NEGATIVE 12/08/2008 1009   KETONESUR NEGATIVE 12/08/2008 1009   UROBILINOGEN 0.2 12/08/2008 1009   NITRITE NEGATIVE 12/08/2008 1009   LEUKOCYTESUR TRACE 12/08/2008 1009    STUDIES: Dg Chest 2 View  11/06/2012   *RADIOLOGY REPORT*  Clinical Data: Breast cancer.  Cough.  Evaluate for metastatic  disease.  Hypertension treated medically  CHEST - 2 VIEW  Comparison: 10/18/2005  Findings: The patient has undergone right mastectomy Surgical clips are identified in the left breast and both axillary regions.  Heart size is  mildly enlarged and stable.  Mediastinal contours are otherwise within normal limits with the exception of mild aortic ectasia and calcification.  The lung fields are clear with no signs of focal infiltrate or congestive failure.  No pleural fluid or significant peribronchial cuffing is seen.  Bony structures are notable for lower thoracic and lumbar rotoscoliosis and degenerative change.  IMPRESSION: Stable cardiopulmonary appearance with no new focal or acute abnormality identified.  Specifically no radiographic evidence for pulmonary metastatic disease is noted   Original Report Authenticated By: Rhodia Albright, M.D.    ASSESSMENT: 79 y.o. [place] woman status post:  (1) Left modified radical mastectomy with TRAM reconstruction July 1998 for a 5.5 cm invasive ductal carcinoma involving 1 out of 14 lymph nodes, estrogen and progesterone receptor negative, treated with doxorubicin x4, paclitaxel x4, then CMF x4, then radiation.  All treatment was completed July 1999.  (2) Right mastectomy and sentinel lymph node dissection with implant reconstruction July 2006 for ductal carcinoma in situ.  This tumor was estrogen receptor positive, PR negative.   PLAN: Lurleen is doing well as far as her breast cancer is concerned. I have discussed that her need or lack of need of mammography with Dr. Jeralyn Ruths. She feels the patient 10th safely forego mammography in the future assuming no physical changes noted over her breasts..  I discussed this with the patient and what we decided to do is switched to a once a year chest x-ray. We will obtain one today. I think this will be more informative and her case, since really all she needs is a physician physical exam as far as her breasts  are concerned.  She is going to see [place] again in one year. She knows to call for any problems that may develop before the next visit here.  MAGRINAT,GUSTAV C    11/10/2012

## 2012-11-11 ENCOUNTER — Telehealth: Payer: Self-pay | Admitting: *Deleted

## 2012-11-11 NOTE — Telephone Encounter (Signed)
Pt was asleep so i made the person aware that answered the phone that bi would mail a letter/avs form Dr. Darnelle Catalan...td

## 2012-11-13 ENCOUNTER — Encounter: Payer: Self-pay | Admitting: Internal Medicine

## 2012-11-13 ENCOUNTER — Ambulatory Visit (INDEPENDENT_AMBULATORY_CARE_PROVIDER_SITE_OTHER): Payer: Medicare Other | Admitting: Internal Medicine

## 2012-11-13 VITALS — BP 126/78 | HR 99 | Temp 98.4°F | Resp 12 | Ht 60.5 in | Wt 167.0 lb

## 2012-11-13 DIAGNOSIS — E039 Hypothyroidism, unspecified: Secondary | ICD-10-CM

## 2012-11-13 DIAGNOSIS — M899 Disorder of bone, unspecified: Secondary | ICD-10-CM

## 2012-11-13 DIAGNOSIS — E785 Hyperlipidemia, unspecified: Secondary | ICD-10-CM

## 2012-11-13 DIAGNOSIS — Z0000 Encounter for general adult medical examination without abnormal findings: Secondary | ICD-10-CM

## 2012-11-13 DIAGNOSIS — I10 Essential (primary) hypertension: Secondary | ICD-10-CM

## 2012-11-13 DIAGNOSIS — Z23 Encounter for immunization: Secondary | ICD-10-CM

## 2012-11-13 LAB — LIPID PANEL
HDL: 44.8 mg/dL (ref >39.00)
LDL Cholesterol: 119 mg/dL — ABNORMAL HIGH (ref 0–99)
Total CHOL/HDL Ratio: 4
VLDL: 21.4 mg/dL (ref 0.0–40.0)

## 2012-11-13 LAB — BASIC METABOLIC PANEL
CO2: 26 mEq/L (ref 19–32)
Calcium: 9.7 mg/dL (ref 8.4–10.5)
GFR: 67.18 mL/min (ref >60.00)
Sodium: 136 mEq/L (ref 135–145)

## 2012-11-13 LAB — TSH: TSH: 1.8 u[IU]/mL (ref 0.35–5.50)

## 2012-11-13 LAB — HEPATIC FUNCTION PANEL
Alkaline Phosphatase: 58 U/L (ref 39–117)
Bilirubin, Direct: 0 mg/dL (ref 0.0–0.3)
Total Protein: 7.6 g/dL (ref 6.0–8.3)

## 2012-11-13 MED ORDER — ATENOLOL 50 MG PO TABS: ORAL_TABLET | ORAL | Status: DC

## 2012-11-13 MED ORDER — PRAVASTATIN SODIUM 20 MG PO TABS: ORAL_TABLET | ORAL | Status: DC

## 2012-11-13 MED ORDER — LEVOTHYROXINE SODIUM 25 MCG PO TABS: 25.0000 ug | ORAL_TABLET | Freq: Every day | ORAL | Status: DC

## 2012-11-13 NOTE — Progress Notes (Signed)
Subjective:    Patient ID: Andrea Kirby, female    DOB: 12/30/1933, 79 y.o.   MRN: [id number]  HPI Medicare Wellness Visit:  Psychosocial & medical history were reviewed as required by Medicare (abuse,antisocial behavioral risks,firearm risk).  Social history: caffeine:tea 1 glass occasionally  , alcohol: very rarely  ,  tobacco use: never   Exercise :  See below Home & personal  safety / fall risk:no Limitations of activities of daily living:no Seatbelt  and smoke alarm use:yes Power of Attorney/Living Will status : in place Ophthalmology exam status : current Hearing evaluation status:  current Orientation :oriented X 3 Memory & recall : good Spelling  testing: good Active depression / anxiety: denied Foreign travel history : last [place] 2012 Immunization status for Shingles /Flu/ PNA/ tetanus : PNA today Transfusion history:  no Preventive health surveillance status of colonoscopy, BMD , mammograms,PAP as per protocol/ YQM:VHQIONG Dental care:  Every 12 mos Chart reviewed &  Updated. Active issues reviewed & addressed.      Review of Systems She is on a heart healthy diet; she exercises as walking 30 minutes 3 times per week without symptoms. Specifically she denies chest pain, palpitations, dyspnea, or claudication. Family history is positive for premature coronary disease in a P aunt. Advanced cholesterol testing reveals her LDL goal is less than 120 , ideally < 90. There is medication compliance with the statin; but she takes it only every other day. Significant abdominal symptoms, memory deficit, or myalgias denied. .     Objective:   Physical Exam Gen.: Healthy and well-nourished in appearance. Alert, appropriate and cooperative throughout exam.Appears younger than stated age  Head: Normocephalic without obvious abnormalities Eyes: No corneal or conjunctival inflammation noted.  Extraocular motion intact. Vision grossly normal with lenses Ears: External  ear exam  reveals no significant lesions or deformities. Perforation R TM ; hearing aid on L.  Nose: External nasal exam reveals no deformity or inflammation. Nasal mucosa are pink and moist. No lesions or exudates noted.  Mouth: Oral mucosa and oropharynx reveal no lesions or exudates. Teeth in good repair. Neck: No deformities, masses, or tenderness noted. Range of motion decreased. Thyroid normal. Lungs: Normal respiratory effort; chest expands symmetrically. Lungs are clear to auscultation without rales, wheezes, or increased work of breathing. Heart: Normal rate and rhythm. Normal S1 ; slurring  S2. No gallop, click, or rub. No murmur. Abdomen: Bowel sounds normal; abdomen soft and nontender. No masses, organomegaly or hernias noted. Genitalia: As per Gyn                                  Musculoskeletal/extremities: There is some asymmetry of the posterior thoracic musculature suggesting occult scoliosis. No clubbing, cyanosis, edema, or significant extremity  deformity noted. Range of motion normal .Tone & strength  Normal. Lymphedema LUE Joints  reveal mild  DJD DIP changes. Nail health good. Able to lie down & sit up w/o help. Negative SLR bilaterally Vascular: Carotid, radial artery, dorsalis pedis and  posterior tibial pulses are full and equal. No bruits present. Neurologic: Alert and oriented x3. Deep tendon reflexes symmetrical and normal.       Skin: Intact without suspicious lesions or rashes. Lymph: No cervical, axillary lymphadenopathy present. Psych: Mood and affect are normal. Normally interactive  Assessment & Plan:  #1 Medicare Wellness Exam; criteria met ; data entered #2 Problem List/Diagnoses reviewed Plan:  Assessments made/ Orders entered  

## 2012-11-13 NOTE — Patient Instructions (Addendum)

## 2012-11-20 ENCOUNTER — Encounter: Payer: Self-pay | Admitting: Gynecology

## 2012-11-20 ENCOUNTER — Ambulatory Visit (INDEPENDENT_AMBULATORY_CARE_PROVIDER_SITE_OTHER): Payer: Medicare Other | Admitting: Gynecology

## 2012-11-20 VITALS — BP 134/90

## 2012-11-20 DIAGNOSIS — R350 Frequency of micturition: Secondary | ICD-10-CM

## 2012-11-20 DIAGNOSIS — IMO0001 Reserved for inherently not codable concepts without codable children: Secondary | ICD-10-CM

## 2012-11-20 DIAGNOSIS — R300 Dysuria: Secondary | ICD-10-CM

## 2012-11-20 LAB — URINALYSIS W MICROSCOPIC + REFLEX CULTURE
Bilirubin Urine: NEGATIVE
Casts: NONE SEEN
Glucose, UA: NEGATIVE mg/dL
Ketones, ur: NEGATIVE mg/dL
pH: 5.5 (ref 5.0–8.0)

## 2012-11-20 MED ORDER — PHENAZOPYRIDINE HCL 200 MG PO TABS: 200.0000 mg | ORAL_TABLET | Freq: Three times a day (TID) | ORAL | Status: DC | PRN

## 2012-11-20 MED ORDER — CLINDAMYCIN PHOSPHATE 2 % VA CREA: 1.0000 | TOPICAL_CREAM | Freq: Every day | VAGINAL | Status: DC

## 2012-11-20 NOTE — Progress Notes (Addendum)
79 y/o patient presented to the office today with several days' history of frequency and occasional dysuria. She tried over-the-counter cranberry juice and it seemed to help her temporarily.  She denied seeing any blood in her urine. She denied and back pain, fever, chills, nausea or vomiting.  Exam: Back: No CVA tenderness Abdomen: Soft nontender no rebound guarding Pelvic: Bartholin urethra Skene's within normal limits Vagina: Atrophic friable vagina vaginal cuff intact Bimanual exam: No tenderness elicited Rectal exam: Not done  Assessment/plan: Urinalysis inconclusive. Will await results of urine culture today. Patient will be given prescription of Pyridium 200 mg to take 1 by mouth 3 times a day for the next 2-3 days to alleviate some of her discomfort. She was given a prescription for Cleocin vaginal cream to apply intravaginally each bedtime for 5 days.

## 2012-11-24 ENCOUNTER — Other Ambulatory Visit: Payer: Self-pay | Admitting: Gynecology

## 2012-11-24 DIAGNOSIS — R399 Unspecified symptoms and signs involving the genitourinary system: Secondary | ICD-10-CM

## 2013-02-07 ENCOUNTER — Ambulatory Visit (INDEPENDENT_AMBULATORY_CARE_PROVIDER_SITE_OTHER): Payer: Medicare Other | Admitting: Family Medicine

## 2013-02-07 VITALS — BP 124/76 | HR 76 | Temp 98.0°F | Resp 16 | Ht 60.0 in | Wt 169.8 lb

## 2013-02-07 DIAGNOSIS — J069 Acute upper respiratory infection, unspecified: Secondary | ICD-10-CM

## 2013-02-07 DIAGNOSIS — H6690 Otitis media, unspecified, unspecified ear: Secondary | ICD-10-CM

## 2013-02-07 DIAGNOSIS — J029 Acute pharyngitis, unspecified: Secondary | ICD-10-CM

## 2013-02-07 DIAGNOSIS — H6691 Otitis media, unspecified, right ear: Secondary | ICD-10-CM

## 2013-02-07 MED ORDER — IPRATROPIUM BROMIDE 0.03 % NA SOLN: 2.0000 | Freq: Two times a day (BID) | NASAL | Status: DC

## 2013-02-07 MED ORDER — AMOXICILLIN 875 MG PO TABS: 875.0000 mg | ORAL_TABLET | Freq: Two times a day (BID) | ORAL | Status: DC

## 2013-02-07 NOTE — Patient Instructions (Addendum)
Hot showers or breathing in steam may help loosen the congestion.  Using a netti pot or sinus rinse is also likely to help you feel better and keep this from progressing.  Use the atrovent nasal spray as needed throughout the day.  I recommend augmenting with 12 hr sudafed (behind the counter) and generic mucinex to help you move out the congestion.  If no improvement or you are getting worse, come back as you might need a course of steroids but hopefully with all of the above, you can avoid it.

## 2013-02-07 NOTE — Progress Notes (Signed)
This chart was scribed for Norberto Sorenson, MD, by Yevette Edwards, ED Scribe. The patient's care was started at 6:27 PM.  Subjective:    Patient ID: Andrea Kirby, female    DOB: [birth year], 79 y.o.   MRN: [id number]  Chief Complaint  Patient presents with  . Sore Throat    2-3 days ; husband has been sick  . Cough    HPI HPI Comments: Andrea Kirby is a 79 y.o. female who presents to the [facility] complaining of a gradual-onset sore throat which has been occurring for three days, but which increased in severity today.   Her husband has had similar symptoms for a week. He was diagnosed with strep throat.   She has experienced chills, sinus pressure, a headache, right-sided otalgia, and a non-productive cough.   The pt denies a fever, voice changes, trouble swallowing, diaphoresis, chest pain, appetite changes, dysuria, decreased urination, diarrhea, or constipation.   She has been gargling consistently and she has been taking two doses of advil a day.    The pt denies a h/o tonsillectomy.  She is a non-smoker.   The pt teaches Spanish at [employer]   Past Medical History  Diagnosis Date  . Hyperlipidemia   . CIN I (cervical intraepithelial neoplasia I)   . Atrophic vaginitis   . Adnexal mass     Left  . Hypertension   . Hypothyroidism   . Cancer     Breast cancer-Rt. DCIS-Left stage 3 invasive  . Endometriosis     Dr Eda Paschal    Current Outpatient Prescriptions on File Prior to Visit  Medication Sig Dispense Refill  . atenolol (TENORMIN) 50 MG tablet TAKE 1/2  TABLET BY MOUTH TWICE DAILY  90 tablet  3  . BIOTIN PO Take by mouth.      . Calcium Citrate-Vitamin D 200-250 MG-UNIT TABS Take by mouth daily.        . cholecalciferol (VITAMIN D) 1000 UNITS tablet Take 1,000 Units by mouth daily.      Marland Kitchen levothyroxine (SYNTHROID) 25 MCG tablet Take 1 tablet (25 mcg total) by mouth daily.  90 tablet  3  . Multiple Vitamin (MULTIVITAMIN) tablet Take 1 tablet by mouth  daily.        . Omega-3 Fatty Acids (FISH OIL) 1200 MG CAPS Take by mouth daily.        . pravastatin (PRAVACHOL) 20 MG tablet Daily as directed  90 tablet  1  . augmented betamethasone dipropionate (DIPROLENE) 0.05 % ointment Apply topically 2 (two) times daily.  30 g  0  . cephALEXin (KEFLEX) 500 MG capsule Take 1 capsule (500 mg total) by mouth 2 (two) times daily.  30 capsule  1  . clindamycin (CLEOCIN) 2 % vaginal cream Place 1 Applicatorful vaginally at bedtime.  40 g  0  . phenazopyridine (PYRIDIUM) 200 MG tablet Take 1 tablet (200 mg total) by mouth 3 (three) times daily as needed for pain.  9 tablet  0   No current facility-administered medications on file prior to visit.   Allergies  Allergen Reactions  . Neomycin     rash  . Prednisone   . Sulfonamide Derivatives     ? reaction   Triage Vitals: BP 124/76  Pulse 76  Temp(Src) 98 F (36.7 C) (Oral)  Resp 16  Ht 5' (1.524 m)  Wt 169 lb 12.8 oz (77.021 kg)  BMI 33.16 kg/m2  SpO2 97%    Review of Systems  Constitutional: Positive for chills. Negative for fever, diaphoresis and appetite change.  HENT: Positive for congestion, ear pain, sinus pressure and sore throat. Negative for trouble swallowing and voice change.   Respiratory: Positive for cough.   Cardiovascular: Negative for chest pain.  Gastrointestinal: Negative for vomiting, diarrhea and constipation.  Genitourinary: Negative for dysuria and decreased urine volume.  Neurological: Positive for headaches.       Objective:   Physical Exam  Nursing note and vitals reviewed. Constitutional: She is oriented to person, place, and time. She appears well-developed and well-nourished. No distress.  HENT:  Head: Normocephalic and atraumatic.  Right Ear: Tympanic membrane is perforated and erythematous.  Left Ear: Tympanic membrane is retracted. A middle ear effusion is present.  Nose: Mucosal edema present.  Mouth/Throat: Posterior oropharyngeal erythema present.  No oropharyngeal exudate.  Right TM is opaque, erythematous, and has a perforation present.  Left TM is retracted and has effusion present. Nasal edema.  Erythematous oropharynx. No exudate present. Tonsils not swollen.   Eyes: EOM are normal.  Neck: Neck supple. No tracheal deviation present.  Cardiovascular: Normal rate, regular rhythm and normal heart sounds.   No murmur heard. Pulmonary/Chest: Effort normal and breath sounds normal. No respiratory distress. She has no wheezes. She has no rales.  Musculoskeletal: Normal range of motion.  Lymphadenopathy:       Head (right side): No submandibular, no tonsillar, no preauricular and no posterior auricular adenopathy present.       Head (left side): No submandibular, no tonsillar, no preauricular and no posterior auricular adenopathy present.    She has no cervical adenopathy.       Right: No supraclavicular adenopathy present.       Left: No supraclavicular adenopathy present.  Neurological: She is alert and oriented to person, place, and time.  Skin: Skin is warm and dry.  Psychiatric: She has a normal mood and affect. Her behavior is normal.      Results for orders placed in visit on 02/07/13  POCT RAPID STREP A (OFFICE)      Result Value Range   Rapid Strep A Screen Negative  Negative   Assessment & Plan:  Acute pharyngitis - Plan: POCT rapid strep A, Culture, Group A Strep Otitis media, right - pt will start her ciprodex that she has at home from her ENT dr.  Jovita Gamma snap rx for amox in case sxs worsen w/ f/c, worse ear pain/sinus pain/pharyngitis. URI, acute  Meds ordered this encounter  Medications  . amoxicillin (AMOXIL) 875 MG tablet    Sig: Take 1 tablet (875 mg total) by mouth 2 (two) times daily.    Dispense:  20 tablet    Refill:  0  . ipratropium (ATROVENT) 0.03 % nasal spray    Sig: Place 2 sprays into the nose 2 (two) times daily.    Dispense:  30 mL    Refill:  5

## 2013-02-10 LAB — CULTURE, GROUP A STREP: Organism ID, Bacteria: NORMAL

## 2013-02-13 ENCOUNTER — Other Ambulatory Visit (HOSPITAL_BASED_OUTPATIENT_CLINIC_OR_DEPARTMENT_OTHER): Payer: Medicare Other | Admitting: Lab

## 2013-02-13 ENCOUNTER — Ambulatory Visit (HOSPITAL_COMMUNITY)
Admission: RE | Admit: 2013-02-13 | Discharge: 2013-02-13 | Disposition: A | Discharge status: Home / Self Care | Payer: Medicare Other | Source: Ambulatory Visit | Attending: Oncology | Admitting: Oncology

## 2013-02-13 ENCOUNTER — Telehealth: Payer: Self-pay | Admitting: *Deleted

## 2013-02-13 ENCOUNTER — Other Ambulatory Visit: Payer: Self-pay | Admitting: Oncology

## 2013-02-13 ENCOUNTER — Ambulatory Visit (HOSPITAL_BASED_OUTPATIENT_CLINIC_OR_DEPARTMENT_OTHER): Payer: Medicare Other | Admitting: Oncology

## 2013-02-13 VITALS — BP 142/86 | HR 90 | Temp 98.0°F | Resp 18 | Ht 60.0 in | Wt 168.0 lb

## 2013-02-13 DIAGNOSIS — C50919 Malignant neoplasm of unspecified site of unspecified female breast: Secondary | ICD-10-CM

## 2013-02-13 DIAGNOSIS — Z853 Personal history of malignant neoplasm of breast: Secondary | ICD-10-CM | POA: Insufficient documentation

## 2013-02-13 DIAGNOSIS — R509 Fever, unspecified: Secondary | ICD-10-CM | POA: Insufficient documentation

## 2013-02-13 DIAGNOSIS — Z87898 Personal history of other specified conditions: Secondary | ICD-10-CM

## 2013-02-13 DIAGNOSIS — R918 Other nonspecific abnormal finding of lung field: Secondary | ICD-10-CM | POA: Insufficient documentation

## 2013-02-13 DIAGNOSIS — J40 Bronchitis, not specified as acute or chronic: Secondary | ICD-10-CM

## 2013-02-13 DIAGNOSIS — R05 Cough: Secondary | ICD-10-CM | POA: Insufficient documentation

## 2013-02-13 DIAGNOSIS — I10 Essential (primary) hypertension: Secondary | ICD-10-CM | POA: Insufficient documentation

## 2013-02-13 DIAGNOSIS — R059 Cough, unspecified: Secondary | ICD-10-CM | POA: Insufficient documentation

## 2013-02-13 LAB — COMPREHENSIVE METABOLIC PANEL (CC13)
ALT: 26 U/L (ref 0–55)
AST: 31 U/L (ref 5–34)
Albumin: 3.3 g/dL — ABNORMAL LOW (ref 3.5–5.0)
Anion Gap: 10 mEq/L (ref 3–11)
CO2: 24 mEq/L (ref 22–29)
Calcium: 9.6 mg/dL (ref 8.4–10.4)
Chloride: 102 mEq/L (ref 98–109)
Creatinine: 0.9 mg/dL (ref 0.6–1.1)
Glucose: 134 mg/dl (ref 70–140)
Sodium: 136 mEq/L (ref 136–145)
Total Bilirubin: 0.85 mg/dL (ref 0.20–1.20)

## 2013-02-13 LAB — CBC WITH DIFFERENTIAL/PLATELET
Basophils Absolute: 0.1 10*3/uL (ref 0.0–0.1)
EOS%: 0.6 % (ref 0.0–7.0)
Eosinophils Absolute: 0.1 10*3/uL (ref 0.0–0.5)
HCT: 41.3 % (ref 34.8–46.6)
HGB: 14 g/dL (ref 11.6–15.9)
LYMPH%: 11.2 % — ABNORMAL LOW (ref 14.0–49.7)
MCHC: 34 g/dL (ref 31.5–36.0)
MONO#: 1.5 10*3/uL — ABNORMAL HIGH (ref 0.1–0.9)
NEUT#: 11.6 10*3/uL — ABNORMAL HIGH (ref 1.5–6.5)
NEUT%: 77.8 % — ABNORMAL HIGH (ref 38.4–76.8)
Platelets: 222 10*3/uL (ref 145–400)
WBC: 14.8 10*3/uL — ABNORMAL HIGH (ref 3.9–10.3)

## 2013-02-13 NOTE — Telephone Encounter (Signed)
Pt is already here and aware of her appts...td

## 2013-02-15 NOTE — Progress Notes (Signed)
ID: Andrea Kirby   DOB: Dec 15, 1933  MR#: [id number]  WUJ#:[id number]  [name], MD   INTERVAL HISTORY: Andrea Kirby returns today for an unscheduled visit accompanied by her husband 79. [name] had an episode of strep throat about 3 weeks ago. More recently [facility] developed a cough, lots of phlegm, a sore throat, and fever. She was evaluated at urgent care and started on penicillin. She has taken this now for 5 days. However she continues to have symptoms, including chills, pain in her right ear, cough, yellow phlegm production, and mild hemoptysis. She requested to be evaluated further.   REVIEW OF SYSTEMS: She has not had any unusual headaches, visual changes, nausea, vomiting, dizziness, gait imbalance, or stiff neck. As noted she has had mild hemoptysis but that appears to have resolved. She is producing "gray colored" sputum at present. She has had no temperatures up of 99.6 that she is aware of. She has not had arthralgias/myalgias or diarrhea. A detailed review of systems today was otherwise stable  PAST MEDICAL HISTORY: Past Medical History  Diagnosis Date  . Hyperlipidemia   . CIN I (cervical intraepithelial neoplasia I)   . Atrophic vaginitis   . Adnexal mass     Left  . Hypertension   . Hypothyroidism   . Cancer     Breast cancer-Rt. DCIS-Left stage 3 invasive  . Endometriosis     Dr Eda Paschal    PAST SURGICAL HISTORY: Past Surgical History  Procedure Laterality Date  . Mastectomy  1998 & 2006    Breast cancer R and L breast  . Bilateral salpingoophorectomy      endometrosis  . Colonoscopy w/ polypectomy      no polyp 2008  . Abdominal hysterectomy  1982    BSO  . Breast surgery  [id number]    Mastectomy X 2  . Breast surgery      Reconstruction  . Cesarean section    . Oophorectomy  1982    BSO  . Colposcopy    . Appendectomy      FAMILY HISTORY Family History  Problem Relation Age of Onset  . Heart disease Mother     CHF  . Diabetes Mother   .  Hypertension Mother   . Lung cancer Father   . Diabetes Sister   . Hypertension Sister   . Emphysema Sister   . Cancer Brother     metastatic rectal cancer  . Hypertension Brother   . Aneurysm Brother     abdominal aneursym  . Stroke Paternal Aunt     X3 ; in 30s  . Stroke Paternal Uncle     in 16s  . Stroke Paternal Grandmother     in 27s  . Cancer Paternal Aunt     Stomach cancer    GYNECOLOGIC HISTORY: GX P1.  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 Her husband also was a professor. Their son, [name], lives and works in [place]. They have no grandchildren. 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   ADVANCED DIRECTIVES:  HEALTH MAINTENANCE: History  Substance Use Topics  . Smoking status: Never Smoker   . Smokeless tobacco: Not on file  . Alcohol Use: Yes     Comment: very rarely     Colonoscopy:  PAP:  Bone density:  Lipid panel:  Allergies  Allergen Reactions  . Neomycin     rash  . Prednisone   . Sulfonamide Derivatives     ? reaction    Current  Outpatient Prescriptions  Medication Sig Dispense Refill  . amoxicillin (AMOXIL) 875 MG tablet Take 1 tablet (875 mg total) by mouth 2 (two) times daily.  20 tablet  0  . atenolol (TENORMIN) 50 MG tablet TAKE 1/2  TABLET BY MOUTH TWICE DAILY  90 tablet  3  . BIOTIN PO Take by mouth.      . Calcium Citrate-Vitamin D 200-250 MG-UNIT TABS Take by mouth daily.        . cholecalciferol (VITAMIN D) 1000 UNITS tablet Take 1,000 Units by mouth daily.      Marland Kitchen ipratropium (ATROVENT) 0.03 % nasal spray Place 2 sprays into the nose 2 (two) times daily.  30 mL  5  . levothyroxine (SYNTHROID) 25 MCG tablet Take 1 tablet (25 mcg total) by mouth daily.  90 tablet  3  . Multiple Vitamin (MULTIVITAMIN) tablet Take 1 tablet by mouth daily.        . Omega-3 Fatty Acids (FISH OIL) 1200 MG CAPS Take by mouth daily.        . pravastatin (PRAVACHOL) 20 MG tablet Daily as directed  90 tablet  1   No current  facility-administered medications for this visit.   Objective: Middle-aged white woman who appears anxious  Filed Vitals:   02/13/13 1009  BP: 142/86  Pulse: 90  Temp: 98 F (36.7 C)  Resp: 18       Body mass index is 32.81 kg/(m^2).    ECOG FS: 1  Sclerae unicteric Oropharynx shows no erythema or exudate; right ear has waxed obscuring the TM No cervical or supraclavicular adenopathy Lungs no rales or rhonchi with fair excursion bilaterally Heart regular rate and rhythm, no murmur appreciated Abd soft, nontender, positive bowel sounds, no organomegaly. MSK no focal spinal tenderness, no upper extremity lymphedema Neuro: nonfocal, well-oriented, appropriate affect Breasts: The right breast is status post mastectomy with implant reconstruction. There is no suspicious finding. The left breast is status post mastectomy with TRAM reconstruction. There is no evidence of recurrence. Both axillae are benign  LAB RESULTS: Lab Results  Component Value Date   WBC 14.8* 02/13/2013   NEUTROABS 11.6* 02/13/2013   HGB 14.0 02/13/2013   HCT 41.3 02/13/2013   MCV 91.0 02/13/2013   PLT 222 02/13/2013      Chemistry      Component Value Date/Time   NA 136 02/13/2013 0924   NA 136 11/13/2012 1137   K 4.3 02/13/2013 0924   K 4.2 11/13/2012 1137   CL 101 11/13/2012 1137   CO2 24 02/13/2013 0924   CO2 26 11/13/2012 1137   BUN 16.7 02/13/2013 0924   BUN 21 11/13/2012 1137   CREATININE 0.9 02/13/2013 0924   CREATININE 0.9 11/13/2012 1137      Component Value Date/Time   CALCIUM 9.6 02/13/2013 0924   CALCIUM 9.7 11/13/2012 1137   ALKPHOS 68 02/13/2013 0924   ALKPHOS 58 11/13/2012 1137   AST 31 02/13/2013 0924   AST 24 11/13/2012 1137   ALT 26 02/13/2013 0924   ALT 23 11/13/2012 1137   BILITOT 0.85 02/13/2013 0924   BILITOT 0.5 11/13/2012 1137       Lab Results  Component Value Date   LABCA2 12 02/08/2009    No components found with this basename: [id number]    No results found for  this basename: INR,  in the last 168 hours  Urinalysis    Component Value Date/Time   COLORURINE YELLOW 11/20/2012 1107   APPEARANCEUR CLEAR 11/20/2012  1107   LABSPEC <1.005* 11/20/2012 1107   PHURINE 5.5 11/20/2012 1107   GLUCOSEU NEG 11/20/2012 1107   HGBUR TRACE* 11/20/2012 1107   BILIRUBINUR NEG 11/20/2012 1107   KETONESUR NEG 11/20/2012 1107   PROTEINUR NEG 11/20/2012 1107   UROBILINOGEN 0.2 11/20/2012 1107   NITRITE NEG 11/20/2012 1107   LEUKOCYTESUR SMALL* 11/20/2012 1107    STUDIES: Dg Chest 2 View  02/13/2013   CLINICAL DATA:  Productive cough, low grade fever, hypertension, and history of breast cancer.  EXAM: CHEST  2 VIEW  COMPARISON:  Multiple priors  FINDINGS: The cardiomediastinal silhouette is unchanged. No edema. Mild left basilar opacity. No pleural effusion or pneumothorax. Status post right mastectomy. No acute osseous abnormality.  IMPRESSION: Mild left basilar opacity, likely atelectasis. Developing infection is not excluded.   Electronically Signed   By: Jerene Dilling M.D.   On: 02/13/2013 10:43    ASSESSMENT: 79 y.o. [place] woman status post:  (1) Left modified radical mastectomy with TRAM reconstruction July 1998 for a 5.5 cm invasive ductal carcinoma involving 1 out of 14 lymph nodes, estrogen and progesterone receptor negative, treated with doxorubicin x4, paclitaxel x4, then CMF x4, then radiation.  All treatment was completed July 1999.  (2) Right mastectomy and sentinel lymph node dissection with implant reconstruction July 2006 for ductal carcinoma in situ.  This tumor was estrogen receptor positive, PR negative.   PLAN:  I think Blessen is having significant bronchitis, complicated by sinusitis. Even though she is taking penicillin as well as pseudoephedrine Mucinex, her symptoms are worsening rather than clearing. I think she would benefit from Zithromax, and I wrote about prescriptions today. I also suggested she drink lots of liquids. Hopefully with  these simple interventions her symptoms were clear over the next few days. Otherwise she will call me.  Note that Azul is scheduled to return to [place]. I have arranged followup here after her return, which will be July of next year. MAGRINAT,GUSTAV C    02/15/2013

## 2013-02-19 ENCOUNTER — Other Ambulatory Visit: Payer: Self-pay | Admitting: Oncology

## 2013-02-24 ENCOUNTER — Ambulatory Visit (INDEPENDENT_AMBULATORY_CARE_PROVIDER_SITE_OTHER): Payer: Medicare Other | Admitting: Gynecology

## 2013-02-24 ENCOUNTER — Encounter: Payer: Self-pay | Admitting: Gynecology

## 2013-02-24 ENCOUNTER — Other Ambulatory Visit (HOSPITAL_COMMUNITY)
Admission: RE | Admit: 2013-02-24 | Discharge: 2013-02-24 | Disposition: A | Discharge status: Home / Self Care | Payer: Medicare Other | Source: Ambulatory Visit | Attending: Gynecology | Admitting: Gynecology

## 2013-02-24 VITALS — BP 130/82 | Ht 60.5 in | Wt 167.0 lb

## 2013-02-24 DIAGNOSIS — Z853 Personal history of malignant neoplasm of breast: Secondary | ICD-10-CM

## 2013-02-24 DIAGNOSIS — Z01419 Encounter for gynecological examination (general) (routine) without abnormal findings: Secondary | ICD-10-CM | POA: Insufficient documentation

## 2013-02-24 DIAGNOSIS — Z1151 Encounter for screening for human papillomavirus (HPV): Secondary | ICD-10-CM | POA: Insufficient documentation

## 2013-02-24 DIAGNOSIS — M899 Disorder of bone, unspecified: Secondary | ICD-10-CM

## 2013-02-24 DIAGNOSIS — Z124 Encounter for screening for malignant neoplasm of cervix: Secondary | ICD-10-CM

## 2013-02-24 DIAGNOSIS — N393 Stress incontinence (female) (male): Secondary | ICD-10-CM

## 2013-02-24 DIAGNOSIS — Z1272 Encounter for screening for malignant neoplasm of vagina: Secondary | ICD-10-CM

## 2013-02-24 DIAGNOSIS — M8580 Other specified disorders of bone density and structure, unspecified site: Secondary | ICD-10-CM

## 2013-02-24 DIAGNOSIS — N952 Postmenopausal atrophic vaginitis: Secondary | ICD-10-CM

## 2013-02-24 NOTE — Addendum Note (Signed)
Addended by: Bertram Savin A on: 02/24/2013 12:15 PM   Modules accepted: Orders

## 2013-02-24 NOTE — Patient Instructions (Signed)
Kegel Exercises The goal of Kegel exercises is to isolate and exercise your pelvic floor muscles. These muscles act as a hammock that supports the rectum, vagina, small intestine, and uterus. As the muscles weaken, the hammock sags and these organs are displaced from their normal positions. Kegel exercises can strengthen your pelvic floor muscles and help you to improve bladder and bowel control, improve sexual response, and help reduce many problems and some discomfort during pregnancy. Kegel exercises can be done anywhere and at any time. HOW TO PERFORM KEGEL EXERCISES 1. Locate your pelvic floor muscles. To do this, squeeze (contract) the muscles that you use when you try to stop the flow of urine. You will feel a tightness in the vaginal area (women) and a tight lift in the rectal area (men and women). 2. When you begin, contract your pelvic muscles tight for 2 5 seconds, then relax them for 2 5 seconds. This is one set. Do 4 5 sets with a short pause in between. 3. Contract your pelvic muscles for 8 10 seconds, then relax them for 8 10 seconds. Do 4 5 sets. If you cannot contract your pelvic muscles for 8 10 seconds, try 5 7 seconds and work your way up to 8 10 seconds. Your goal is 4 5 sets of 10 contractions each day. Keep your stomach, buttocks, and legs relaxed during the exercises. Perform sets of both short and long contractions. Vary your positions. Perform these contractions 3 4 times per day. Perform sets while you are:   Lying in bed in the morning.  Standing at lunch.  Sitting in the late afternoon.  Lying in bed at night. You should do 40 50 contractions per day. Do not perform more Kegel exercises per day than recommended. Overexercising can cause muscle fatigue. Continue these exercises for for at least 15 20 weeks or as directed by your caregiver. Document Released: 04/02/2012 Document Reviewed: 01/10/2012 [facility] Patient Information 2014 [place], [place].  Ejercicios de  Kegel  Licensed conveyancer) El objetivo de los ejercicios de Kegel es aislar y Company secretary los msculos del suelo plvico. Estos msculos actan como una hamaca que soporta el recto, la vagina, el intestino delgado y [place]. A medida que los msculos se debilitan, se hunden y esos rganos son desplazados de sus posiciones normales. Los ejercicios de Kegel fortalecen los msculos del suelo plvico y ayudan a Careers information officer control de la vejiga y del intestino, mejoran la respuesta sexual y Biomedical scientist a Lawyer problemas y Chartered certified accountant durante el [place]. Se pueden hacer en cualquier lugar y en cualquier momento.  CMO REALIZAR LOS EJERCICIOS DE KEGEL  1. Localice los msculos del suelo plvico. Para ello apriete (contraiga) los msculos que se utilizan para tratar de TEFL teacher el flujo de [place]. Usted se sentir una opresin en el rea vaginal (mujeres) y que se eleva y comprime la zona rectal (hombres y mujeres). 2. Para comenzar, contraiga los msculos plvicos durante 2 a 5 segundos y despus reljelos durante 2 a 5 segundos. Esta es una serie. Repita 4 a 5 series con una breve pausa en el medio. 3. Contraiga los msculos de la pelvis durante 8 a 10 segundos y despus reljelos durante 8 a 10 segundos. Repita 4 a 5 series. Si no puede contraer los msculos de la pelvis durante 8 a 10 segundos, trate de 5 a 7 segundos y Scientist, research (physical sciences) 8 a 10 segundos. Su objetivo es Education officer, environmental 4 a 5 series de 10 Advice worker.  Mantenga el estmago, las nalgas y las piernas relajadas durante los ejercicios. Realice ambas series de contracciones, cortas y largas. Vare las posiciones. Haga las contracciones 3 a 4 veces por Futures trader. Haga las series mientras est:    Acostado en la cama por la [place].  De pie en el almuerzo.  Sentado por las tardes.  Acostado en la cama por la noche.  Debe hacer 40 a 50 contracciones por da. No realice ms ejercicios de Kegel por da que lo recomendado. El exceso de  ejercicio puede causar fatiga muscular. Contine con estos ejercicios durante al menos 15 a 20 semanas o segn las indicaciones de su mdico.  Document Released: 04/02/2012 [facility] Patient Information 2014 [place], [place].

## 2013-02-24 NOTE — Progress Notes (Signed)
Andrea Kirby 26-Sep-1933 [id number]   History:    79 y.o.  For followup exam. Patient stated for several months she noticed some vaginal bumps in the external genitalia. She does suffer from vaginal dryness and occasionally she would use Astra glide when necessary. Patient states that at times when she sneezes she may leak a little urine.  Review of her records indicated the following:  1982 patient had total abdominal hysterectomy with bilateral salpingo-oophorectomy for endometriosis Left modified radical mastectomy with TRAM reconstruction July 1998 for a 5.5 cm invasive ductal carcinoma involving 1 out of 14 lymph nodes, estrogen and progesterone receptor negative, treated with doxorubicin x4, paclitaxel x4, then CMF x4, then radiation. All treatment was completed July 1999. Right mastectomy and sentinel lymph node dissection with implant reconstruction July 2006 for ductal carcinoma in situ. This tumor was estrogen receptor positive, PR negative.   Patient has been followed by Dr. Conchita Paris medical oncologist. Patient recently was treated by him for acute bronchitis/sinusitis and is currently on Zithromax.  Patient was seen by my partner on 02/14/2012 for followup ultrasound as a result of an adnexal cyst after bilateral salpingo-oophorectomy. The differential includes a peritoneal inclusion cyst or an ovarian remnant. On ultrasound today her uterus is surgically absent. Her right adnexa is negative without an ovary. In the left adnexa there was an echo-free thin walled cyst of 3.7 cm. It is unchanged from last year in appearance and size. We have been aware of a cyst since 2004. At that time it was 2.4 cm. It has slowly grown over not since 2007. There is no evidence of ovarian remnant.  Patient's last bone density study was in 2013 with her lowest T. Score at the left femoral neck with a value of -1.6 and normal FRAX analysis.  Greater than 30 years ago patient had cervical dysplasia  and was treated by Dr. Laural Benes with conization. She has had normal Pap smears since then.   Patient's brother had colon cancer. Patient's last colonoscopy was in 2009 by Dr. Matthias Hughs   Past medical history,surgical history, family history and social history were all reviewed and documented in the EPIC chart.  Gynecologic History No LMP recorded. Patient has had a hysterectomy. Contraception: status post hysterectomy Last Pap: 2012. Results were: normal Last mammogram: 2014. Results were: normal  Obstetric History OB History  Gravida Para Term Preterm AB SAB TAB Ectopic Multiple Living  1 1 1       1     # Outcome Date GA Lbr Len/2nd Weight Sex Delivery Anes PTL Lv  1 TRM                ROS: A ROS was performed and pertinent positives and negatives are included in the history.  GENERAL: No fevers or chills. HEENT: No change in vision, no earache, sore throat or sinus congestion. NECK: No pain or stiffness. CARDIOVASCULAR: No chest pain or pressure. No palpitations. PULMONARY: No shortness of breath, cough or wheeze. GASTROINTESTINAL: No abdominal pain, nausea, vomiting or diarrhea, melena or bright red blood per rectum. GENITOURINARY: No urinary frequency, urgency, hesitancy or dysuria. MUSCULOSKELETAL: No joint or muscle pain, no back pain, no recent trauma. DERMATOLOGIC: No rash, no itching, no lesions. ENDOCRINE: No polyuria, polydipsia, no heat or cold intolerance. No recent change in weight. HEMATOLOGICAL: No anemia or easy bruising or bleeding. NEUROLOGIC: No headache, seizures, numbness, tingling or weakness. PSYCHIATRIC: No depression, no loss of interest in normal activity or change in sleep pattern.  Exam: chaperone present  BP 130/82  Ht 5' 0.5" (1.537 m)  Wt 167 lb (75.751 kg)  BMI 32.07 kg/m2  Body mass index is 32.07 kg/(m^2).  General appearance : Well developed well nourished female. No acute distress HEENT: Neck supple, trachea midline, no carotid bruits, no  thyroidmegaly Lungs: Clear to auscultation, no rhonchi or wheezes, or rib retractions  Heart: Regular rate and rhythm, no murmurs or gallops Breast:Examined in sitting and supine position were symmetrical in appearance, no palpable masses or tenderness,  no skin retraction, no nipple inversion, no nipple discharge, no skin discoloration, no axillary or supraclavicular lymphadenopathy Abdomen: no palpable masses or tenderness, no rebound or guarding Extremities: no edema or skin discoloration or tenderness  Pelvic:  Bartholin, Urethra, Skene Glands: Within normal limits             Vagina: No gross lesions or discharge, atrophic changes  Cervix:absent  Uterus: Absent  Adnexa  Without masses or tenderness  Anus and perineum  normal   Rectovaginal  normal sphincter tone without palpated masses or tenderness             Hemoccult cards provided   Assessment/Plan:  79 y.o. with past history of breast cancer been followed by the oncologist doing well. She is leaving for 4 for 6 months. I've her mind and her to schedule her colonoscopy when she returns back. She will be provided with Hemoccult cards for testing. We discussed importance of calcium and vitamin D in regular exercise for osteoporosis prevention. She would need a bone density study next year. Pap smear was done today. Blood work drawn recently by her PCP. She will continue on her Zithromax for her bronchitis/sinusitis for which she is to [place] when she recovers from this she will get her flu vaccine. All her other vaccinations are up to date.   Patient with mild stress urinary incontinence. No evidence of pelvic organ prolapse. Patient will be provided with instructions on Kegel exercises. The vulvar bumps that she was concerned about the very small sebaceous cysts (epidermal inclusion cyst) patient was reassured.  Note: This dictation was prepared with  Dragon/digital dictation along withSmart phrase technology. Any transcriptional  errors that result from this process are unintentional.   Ok Edwards MD, 11:49 AM 02/24/2013

## 2013-03-05 ENCOUNTER — Other Ambulatory Visit: Payer: Self-pay

## 2013-03-10 ENCOUNTER — Encounter: Payer: Self-pay | Admitting: Obstetrics and Gynecology

## 2013-10-05 ENCOUNTER — Telehealth: Payer: Self-pay | Admitting: Oncology

## 2013-10-05 NOTE — Telephone Encounter (Signed)
pt cld to get appt times-gave pt appt time & dates for appt-pt understood

## 2013-11-05 ENCOUNTER — Other Ambulatory Visit: Payer: Self-pay | Admitting: *Deleted

## 2013-11-05 ENCOUNTER — Other Ambulatory Visit (HOSPITAL_BASED_OUTPATIENT_CLINIC_OR_DEPARTMENT_OTHER): Payer: Medicare Other

## 2013-11-05 DIAGNOSIS — C50919 Malignant neoplasm of unspecified site of unspecified female breast: Secondary | ICD-10-CM

## 2013-11-05 DIAGNOSIS — E785 Hyperlipidemia, unspecified: Secondary | ICD-10-CM

## 2013-11-05 DIAGNOSIS — Z853 Personal history of malignant neoplasm of breast: Secondary | ICD-10-CM

## 2013-11-05 LAB — COMPREHENSIVE METABOLIC PANEL (CC13)
ALT: 28 U/L (ref 0–55)
ANION GAP: 9 meq/L (ref 3–11)
AST: 23 U/L (ref 5–34)
Albumin: 3.6 g/dL (ref 3.5–5.0)
Alkaline Phosphatase: 69 U/L (ref 40–150)
BUN: 17.8 mg/dL (ref 7.0–26.0)
CO2: 23 meq/L (ref 22–29)
Calcium: 9.4 mg/dL (ref 8.4–10.4)
Chloride: 107 mEq/L (ref 98–109)
Creatinine: 0.9 mg/dL (ref 0.6–1.1)
GLUCOSE: 101 mg/dL (ref 70–140)
Potassium: 4.3 mEq/L (ref 3.5–5.1)
SODIUM: 139 meq/L (ref 136–145)
TOTAL PROTEIN: 7.1 g/dL (ref 6.4–8.3)
Total Bilirubin: 0.61 mg/dL (ref 0.20–1.20)

## 2013-11-05 LAB — LIPID PANEL
CHOL/HDL RATIO: 3.3 ratio
CHOLESTEROL: 144 mg/dL (ref 0–200)
HDL: 44 mg/dL (ref >39)
LDL Cholesterol: 77 mg/dL (ref 0–99)
TRIGLYCERIDES: 115 mg/dL (ref <150)
VLDL: 23 mg/dL (ref 0–40)

## 2013-11-05 LAB — CBC WITH DIFFERENTIAL/PLATELET
BASO%: 1 % (ref 0.0–2.0)
Basophils Absolute: 0.1 10*3/uL (ref 0.0–0.1)
EOS%: 1.6 % (ref 0.0–7.0)
Eosinophils Absolute: 0.2 10*3/uL (ref 0.0–0.5)
HCT: 42.2 % (ref 34.8–46.6)
HGB: 13.8 g/dL (ref 11.6–15.9)
LYMPH%: 28.9 % (ref 14.0–49.7)
MCH: 30.5 pg (ref 25.1–34.0)
MCHC: 32.7 g/dL (ref 31.5–36.0)
MCV: 93.2 fL (ref 79.5–101.0)
MONO#: 0.7 10*3/uL (ref 0.1–0.9)
MONO%: 7.7 % (ref 0.0–14.0)
NEUT%: 60.8 % (ref 38.4–76.8)
NEUTROS ABS: 5.8 10*3/uL (ref 1.5–6.5)
PLATELETS: 230 10*3/uL (ref 145–400)
RBC: 4.53 10*6/uL (ref 3.70–5.45)
RDW: 13.4 % (ref 11.2–14.5)
WBC: 9.6 10*3/uL (ref 3.9–10.3)
lymph#: 2.8 10*3/uL (ref 0.9–3.3)

## 2013-11-12 ENCOUNTER — Ambulatory Visit (HOSPITAL_BASED_OUTPATIENT_CLINIC_OR_DEPARTMENT_OTHER): Payer: Medicare Other | Admitting: Oncology

## 2013-11-12 VITALS — BP 138/61 | HR 68 | Temp 98.0°F | Resp 18 | Ht 60.5 in | Wt 167.4 lb

## 2013-11-12 DIAGNOSIS — C50919 Malignant neoplasm of unspecified site of unspecified female breast: Secondary | ICD-10-CM

## 2013-11-12 DIAGNOSIS — Z853 Personal history of malignant neoplasm of breast: Secondary | ICD-10-CM

## 2013-11-12 NOTE — Progress Notes (Signed)
Jaleyah's [place] MD is Juan C. Mia Creek fax [phone], telephone [phone]/31/[phone], address is [address], [place], [place]

## 2013-11-12 NOTE — Addendum Note (Signed)
Addended by: Prentiss Bells on: 11/12/2013 04:16 PM   Modules accepted: Orders

## 2013-11-12 NOTE — Progress Notes (Signed)
ID: Harland German   DOB: 1933-11-25  MR#: [id number]  CSN#:[id number]  PCP: Unice Cobble, MD GYN: SU:  OTHER MD:   INTERVAL HISTORY: Lakyn returns today for followup of her remote breast cancer. As before, she usually spends made through October in [place] and then they winter in [place]. After doing this a few years, she now feels very much at home in her [place] neighborhood and she and Wille Glaser have decided to sell their [place] and move to [place] permanently.  REVIEW OF SYSTEMS: The interval history is generally very stable. She continues to have left upper extremity lymphedema, grade 2, and she is getting physical therapy for that in [place]. She has not had any unusual headaches, visual changes, cough, phlegm production, pleurisy, shortness of breath, or change in bowel or bladder habits. There has been no rash, fever, bleeding, or unexplained fatigue or weight loss. A detailed review of systems today was otherwise negative  PAST MEDICAL HISTORY: Past Medical History  Diagnosis Date  . Hyperlipidemia   . CIN I (cervical intraepithelial neoplasia I)   . Atrophic vaginitis   . Adnexal mass     Left  . Hypertension   . Hypothyroidism   . Cancer     Breast cancer-Rt. DCIS-Left stage 3 invasive  . Endometriosis     Dr Cherylann Banas    PAST SURGICAL HISTORY: Past Surgical History  Procedure Laterality Date  . Mastectomy  1998 & 2006    Breast cancer R and L breast  . Bilateral salpingoophorectomy      endometrosis  . Colonoscopy w/ polypectomy      no polyp 2008  . Abdominal hysterectomy  1982    BSO  . Breast surgery  [id number]    Mastectomy X 2  . Breast surgery      Reconstruction  . Cesarean section    . Oophorectomy  1982    BSO  . Colposcopy    . Appendectomy      FAMILY HISTORY Family History  Problem Relation Age of Onset  . Heart disease Mother     CHF  . Diabetes Mother   . Hypertension Mother   . Lung cancer Father   . Diabetes Sister    . Hypertension Sister   . Emphysema Sister   . Cancer Brother     metastatic rectal cancer  . Hypertension Brother   . Aneurysm Brother     abdominal aneursym  . Stroke Paternal Aunt     X3 ; in 55s  . Stroke Paternal Uncle     in 27s  . Stroke Paternal Grandmother     in 71s  . Cancer Paternal Aunt     Stomach cancer    GYNECOLOGIC HISTORY: GX P1.  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 Her husband also was a professor. Their son, Bennett Scrape, lives and works in [place]. They have no grandchildren. 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   ADVANCED DIRECTIVES:  HEALTH MAINTENANCE: History  Substance Use Topics  . Smoking status: Never Smoker   . Smokeless tobacco: Not on file  . Alcohol Use: Yes     Comment: very rarely     Colonoscopy:  PAP:  Bone density:  Lipid panel:  Allergies  Allergen Reactions  . Neomycin     rash  . Prednisone   . Sulfonamide Derivatives     ? reaction    Current Outpatient Prescriptions  Medication Sig Dispense Refill  . amoxicillin (AMOXIL) 299  MG tablet Take 1 tablet (875 mg total) by mouth 2 (two) times daily.  20 tablet  0  . atenolol (TENORMIN) 50 MG tablet TAKE 1/2  TABLET BY MOUTH TWICE DAILY  90 tablet  3  . BIOTIN PO Take by mouth.      . Calcium Citrate-Vitamin D 200-250 MG-UNIT TABS Take by mouth daily.        . cholecalciferol (VITAMIN D) 1000 UNITS tablet Take 1,000 Units by mouth daily.      Marland Kitchen dextromethorphan-guaiFENesin (MUCINEX DM) 30-600 MG per 12 hr tablet Take 1 tablet by mouth every 12 (twelve) hours.      Marland Kitchen ipratropium (ATROVENT) 0.03 % nasal spray Place 2 sprays into the nose 2 (two) times daily.  30 mL  5  . levothyroxine (SYNTHROID) 25 MCG tablet Take 1 tablet (25 mcg total) by mouth daily.  90 tablet  3  . mometasone (NASONEX) 50 MCG/ACT nasal spray Place 2 sprays into the nose daily.      . Multiple Vitamin (MULTIVITAMIN) tablet Take 1 tablet by mouth daily.        . Omega-3 Fatty  Acids (FISH OIL) 1200 MG CAPS Take by mouth daily.        . pravastatin (PRAVACHOL) 20 MG tablet Daily as directed  90 tablet  1   No current facility-administered medications for this visit.   Objective: Middle-aged white woman in no acute distress  Filed Vitals:   11/12/13 1413  BP: 138/61  Pulse: 68  Temp: 98 F (36.7 C)  Resp: 18       Body mass index is 32.14 kg/(m^2).    ECOG FS: 1  Sclerae unicteric, pupils round and equal Oropharynx clear moist, teeth in good repair No cervical or supraclavicular adenopathy Lungs no rales or rhonchi Heart regular rate and rhythm Abd soft, nontender, positive bowel sounds, no organomegaly. MSK no focal spinal tenderness, chronic left upper extremity lymphedema, unchanged Neuro: nonfocal, well-oriented, positive affect Breasts: The right breast is status post mastectomy with implant reconstruction. There is no suspicious finding. The left breast is status post mastectomy with TRAM reconstruction. There is no evidence of recurrence. Both axillae are benign  LAB RESULTS: Lab Results  Component Value Date   WBC 9.6 11/05/2013   NEUTROABS 5.8 11/05/2013   HGB 13.8 11/05/2013   HCT 42.2 11/05/2013   MCV 93.2 11/05/2013   PLT 230 11/05/2013      Chemistry      Component Value Date/Time   NA 139 11/05/2013 1409   NA 136 11/13/2012 1137   K 4.3 11/05/2013 1409   K 4.2 11/13/2012 1137   CL 101 11/13/2012 1137   CO2 23 11/05/2013 1409   CO2 26 11/13/2012 1137   BUN 17.8 11/05/2013 1409   BUN 21 11/13/2012 1137   CREATININE 0.9 11/05/2013 1409   CREATININE 0.9 11/13/2012 1137      Component Value Date/Time   CALCIUM 9.4 11/05/2013 1409   CALCIUM 9.7 11/13/2012 1137   ALKPHOS 69 11/05/2013 1409   ALKPHOS 58 11/13/2012 1137   AST 23 11/05/2013 1409   AST 24 11/13/2012 1137   ALT 28 11/05/2013 1409   ALT 23 11/13/2012 1137   BILITOT 0.61 11/05/2013 1409   BILITOT 0.5 11/13/2012 1137       Lab Results  Component Value Date   LABCA2 12 02/08/2009    No  components found with this basename: [id number]    No results found for this basename: INR,  in the last 168 hours  Urinalysis    Component Value Date/Time   COLORURINE YELLOW 11/20/2012 1107   [place] 11/20/2012 1107   LABSPEC <1.005* 11/20/2012 1107   PHURINE 5.5 11/20/2012 1107   GLUCOSEU NEG 11/20/2012 1107   GLUCOSEU NEGATIVE 12/08/2008 1009   HGBUR TRACE* 11/20/2012 1107   BILIRUBINUR NEG 11/20/2012 1107   KETONESUR NEG 11/20/2012 1107   PROTEINUR NEG 11/20/2012 1107   UROBILINOGEN 0.2 11/20/2012 1107   NITRITE NEG 11/20/2012 1107   LEUKOCYTESUR SMALL* 11/20/2012 1107    STUDIES: No results found.  ASSESSMENT: 80 y.o. [place] woman status post:  (1) Left modified radical mastectomy with TRAM reconstruction July 1998 for a 5.5 cm invasive ductal carcinoma involving 1 out of 14 lymph nodes, estrogen and progesterone receptor negative, treated with doxorubicin x4, paclitaxel x4, then CMF x4, then radiation.  All treatment was completed July 1999.  (2) Right mastectomy and sentinel lymph node dissection with implant reconstruction July 2006 for ductal carcinoma in situ.  This tumor was estrogen receptor positive, PR negative.   PLAN:  Myleah is doing fine from a breast cancer point of view and there is no evidence of disease recurrence. In fact she is almost certainly cured. Of course in breast cancer at a late recurrence is always possible.  I suggested she could "graduate" at this point, since they are thinking of selling their house here in [place] and living permanently in [place]. However she thinks it may take them "quite a while" to sell the house, so I went ahead and made her a return appointment with me for 1 year from now.  In the meantime she will let me know the name of her primary care physician in [place], whom she likes, and I will send him a copy of her records.  She has a good understanding of the overall plan. She agrees with it. She knows to call for any  problems that may develop before her next visit here. MAGRINAT,GUSTAV C    11/12/2013

## 2013-11-13 ENCOUNTER — Telehealth: Payer: Self-pay | Admitting: Oncology

## 2013-11-13 NOTE — Telephone Encounter (Signed)
s.w. pt and advised on July 2016 appt....pt ok and aware °

## 2013-11-16 ENCOUNTER — Encounter: Payer: Medicare Other | Admitting: Internal Medicine

## 2013-12-08 ENCOUNTER — Ambulatory Visit (INDEPENDENT_AMBULATORY_CARE_PROVIDER_SITE_OTHER): Payer: Medicare Other | Admitting: Internal Medicine

## 2013-12-08 ENCOUNTER — Encounter: Payer: Self-pay | Admitting: Internal Medicine

## 2013-12-08 ENCOUNTER — Other Ambulatory Visit (INDEPENDENT_AMBULATORY_CARE_PROVIDER_SITE_OTHER): Payer: Medicare Other

## 2013-12-08 VITALS — BP 140/88 | HR 68 | Temp 97.8°F | Ht 61.0 in | Wt 166.8 lb

## 2013-12-08 DIAGNOSIS — E039 Hypothyroidism, unspecified: Secondary | ICD-10-CM

## 2013-12-08 DIAGNOSIS — M949 Disorder of cartilage, unspecified: Secondary | ICD-10-CM

## 2013-12-08 DIAGNOSIS — R350 Frequency of micturition: Secondary | ICD-10-CM

## 2013-12-08 DIAGNOSIS — R7309 Other abnormal glucose: Secondary | ICD-10-CM

## 2013-12-08 DIAGNOSIS — E785 Hyperlipidemia, unspecified: Secondary | ICD-10-CM

## 2013-12-08 DIAGNOSIS — M899 Disorder of bone, unspecified: Secondary | ICD-10-CM

## 2013-12-08 DIAGNOSIS — Z860100 Personal history of colon polyps, unspecified: Secondary | ICD-10-CM

## 2013-12-08 LAB — TSH: TSH: 3.81 u[IU]/mL (ref 0.35–4.50)

## 2013-12-08 LAB — URINALYSIS
BILIRUBIN URINE: NEGATIVE
Hgb urine dipstick: NEGATIVE
Ketones, ur: NEGATIVE
LEUKOCYTES UA: NEGATIVE
Nitrite: NEGATIVE
PH: 7 (ref 5.0–8.0)
Specific Gravity, Urine: <=1.005 — AB (ref 1.000–1.030)
TOTAL PROTEIN, URINE-UPE24: NEGATIVE
URINE GLUCOSE: NEGATIVE
Urobilinogen, UA: 0.2 (ref 0.0–1.0)

## 2013-12-08 LAB — HEMOGLOBIN A1C: Hgb A1c MFr Bld: 6 % (ref 4.6–6.5)

## 2013-12-08 NOTE — Assessment & Plan Note (Signed)
A1c

## 2013-12-08 NOTE — Patient Instructions (Addendum)
Your next office appointment will be determined based upon review of your pending labs . Those instructions will be transmitted to you  by mail.   Dr Orlie Pollen FAX [phone]

## 2013-12-08 NOTE — Progress Notes (Signed)
Pre visit review using our clinic review tool, if applicable. No additional management support is needed unless otherwise documented below in the visit note. 

## 2013-12-08 NOTE — Assessment & Plan Note (Signed)
To clarify FH CVA & MI No change in statin

## 2013-12-08 NOTE — Assessment & Plan Note (Signed)
TSH 

## 2013-12-08 NOTE — Progress Notes (Signed)
   Subjective:    Patient ID: Andrea Kirby, female    DOB: 14-Jan-1934, 80 y.o.   MRN: [id number]  HPI  She is here to assess active health issues & conditions. PMH, FH, & Social history verified & updated . Active symptoms include urinary frequency.  She has been compliant with her blood pressure and cholesterol medications. Her Crestor was changed to atorvastatin earlier this year by  Dr.Yordan in [place].  She is not monitoring blood pressures at home. She has no adverse effects from the pressure or cholesterol medications.  She is due for followup colonoscopy with [facility]. She has no active GI symptoms at this time  In reference to her hypothyroidism, her only positive symptom is cold intolerance.  She has seen her Oncologist; extensive lab studies were performed 11/05/13. Renal and hepatic function were normal. Lipids were excellent. HDL was mildly reduced at 44; LDL of 77 was  @ goal .  Fasting glucose was 101; it has been as high as 134. No TSH or A1c was performed.    Review of Systems  Dysuria, pyuria, hematuria, nocturia or polyuria are denied.  Chest pain, palpitations, tachycardia, exertional dyspnea, paroxysmal nocturnal dyspnea, claudication or edema are absent.  Unexplained weight loss, abdominal pain, significant dyspepsia, dysphagia, melena, rectal bleeding, or persistently small caliber stools are denied.   No significant change in weight; significant fatigue; sleep disorder; change in appetite. No blurred, double ,loss of vision No constipation; diarrhea;hoarseness No change in nails,hair,skin No numbness or tingling; tremor No anxiety; depression; panic attacks        Objective:   Physical Exam Positive or pertinent findings include: There is a perforation of the right tympanic membrane. She wears a hearing aid on the left. Arcus senilis is present. Thyroid is small; the left lobe is larger than the right without definite nodules. Posterior tibial  pulses are slightly decreased. She has lymphedema of the left upper extremity.  Appears healthy and well-nourished & in no acute distress No carotid bruits are present.No neck pain distention present at 10 - 15 degrees.  Heart rhythm and rate are normal with no gallop or murmur Chest is clear with no increased work of breathing There is no evidence of aortic aneurysm or renal artery bruits Abdomen soft with no organomegaly or masses. No HJR No clubbing, cyanosis or edema present. Pedal pulses are equal No ischemic skin changes are present . Fingernails/ toenails healthy  Alert and oriented. Strength, tone, DTRs reflexes normal          Assessment & Plan:  See Current Assessment & Plan in Problem List under specific Diagnosis

## 2013-12-08 NOTE — Assessment & Plan Note (Signed)
To contact Dr Cristina Gong for F/U CBC WNL 7/15

## 2013-12-09 LAB — URINE CULTURE
Colony Count: NO GROWTH
ORGANISM ID, BACTERIA: NO GROWTH

## 2013-12-10 NOTE — Assessment & Plan Note (Signed)
Monitor BMD every 25 months through [place] office

## 2013-12-11 ENCOUNTER — Telehealth: Payer: Self-pay | Admitting: *Deleted

## 2013-12-11 NOTE — Telephone Encounter (Signed)
Faxed info per md request below...Andrea Kirby

## 2013-12-11 NOTE — Telephone Encounter (Signed)
Message copied by Earnstine Regal on Fri Dec 11, 2013  3:56 PM ------      Message from: Hendricks Limes      Created: Fri Dec 11, 2013  7:33 AM       Please FAX  12/08/13 office visit & lab results to Dr Orlie Pollen @ [phone].Thanks, Hopp      Also please thank everybody ; I have not received any patient messages, refills, etc. Great team support !       ------

## 2013-12-28 ENCOUNTER — Telehealth: Payer: Self-pay | Admitting: Internal Medicine

## 2013-12-28 MED ORDER — ATORVASTATIN CALCIUM 10 MG PO TABS: 10.0000 mg | ORAL_TABLET | ORAL | Status: DC

## 2013-12-28 NOTE — Telephone Encounter (Signed)
done

## 2013-12-28 NOTE — Telephone Encounter (Signed)
Patient is requesting refill on lipitor.  [facility] on [address].

## 2014-01-14 ENCOUNTER — Other Ambulatory Visit: Payer: Self-pay

## 2014-01-14 DIAGNOSIS — E039 Hypothyroidism, unspecified: Secondary | ICD-10-CM

## 2014-01-14 DIAGNOSIS — I10 Essential (primary) hypertension: Secondary | ICD-10-CM

## 2014-01-14 MED ORDER — LEVOTHYROXINE SODIUM 25 MCG PO TABS: 25.0000 ug | ORAL_TABLET | Freq: Every day | ORAL | Status: DC

## 2014-01-14 MED ORDER — ATENOLOL 50 MG PO TABS: ORAL_TABLET | ORAL | Status: AC

## 2014-02-04 ENCOUNTER — Encounter: Payer: Self-pay | Admitting: Gynecology

## 2014-02-24 ENCOUNTER — Ambulatory Visit (INDEPENDENT_AMBULATORY_CARE_PROVIDER_SITE_OTHER): Payer: Medicare Other | Admitting: Gynecology

## 2014-02-24 ENCOUNTER — Encounter: Payer: Self-pay | Admitting: Gynecology

## 2014-02-24 VITALS — BP 126/84 | Ht 60.5 in | Wt 167.0 lb

## 2014-02-24 DIAGNOSIS — N952 Postmenopausal atrophic vaginitis: Secondary | ICD-10-CM

## 2014-02-24 DIAGNOSIS — M8580 Other specified disorders of bone density and structure, unspecified site: Secondary | ICD-10-CM

## 2014-02-24 DIAGNOSIS — N949 Unspecified condition associated with female genital organs and menstrual cycle: Secondary | ICD-10-CM

## 2014-02-24 DIAGNOSIS — Z853 Personal history of malignant neoplasm of breast: Secondary | ICD-10-CM

## 2014-02-24 NOTE — Patient Instructions (Signed)
Vacuna contra la culebrilla, Lo que debe saber (Shingles Vaccine, What You Need to Know) QU ES LA CULEBRILLA?  Es una erupcin dolorosa de la piel, en la que aparecen ampollas. Esta enfermedad tambin se denomina herpes zoster o zoster.  Se trata de una erupcin que aparece en un lado del rostro o del cuerpo y dura entre 2 y 4 semanas. El sntoma principal es el dolor, que puede ser bastante intenso. Otros sntomas son fiebre, cefalea, escalofros y malestar en el estmago. En casos raros, esta infeccin puede causar neumona, problemas auditivos, ceguera, inflamacin cerebral (encefalitis) o la muerte.  En 1 de cada 5 personas, el dolor intenso puede persistir an despus que la erupcin desaparezca. Este dolor se denomina neurlagia post herptica.  La causa de la culebrilla es el virus varicela-zoster. Este es el mismo virus que causa la varicela. Slo las personas que han sufrido varicela o que han recibido la vacuna contra la varicela pueden tener culebrilla. El virus permanece en el organismo. Puede volver a aparecer muchos aos despus para causar culebrilla.  La culebrilla no se contagia. Sin embargo, una persona que nunca sufri varicela (ni recibi la vacuna) podra contraer varicela contagindose de alguien que padece culebrilla. Esto no es frecuente.  La culebrilla es ms frecuente entre las personas mayores de 50 aos que entre los ms jvenes. Tambin es ms frecuente en personas cuyo sistema inmunolgico est debilitado debido a una enfermedad como el cncer o medicamentos como los corticoides o las drogas utilizadas en quimioterapia.  Al menos 1 milln de personas contrae culebrilla cada ao en los [place]. VACUNA CONTRA LA CULEBRILLA  Esta vacuna fue patentada en 2006. En los ensayos clnicos, se demostr que la vacuna prevena la culebrilla en alrededor de la mitad de las personas. Tambin reduce el dolor asociado a este trastorno.  Se indica una nica dosis de vacuna  en adultos de ms de 60 aos. ALGUNAS PERSONAS NO DEBEN RECIBIR LA VACUNA O DEBEN ESPERAR No deben vacunarse las personas que:  Alguna vez hayan sufrido una reaccin alrgica a la gelatina, al antibitico neomicina o a cualquier otro componente de la vacuna. Si observa una reaccin alrgica grave, comunquese con el mdico.  Su sistema inmunolgico est debilitado debido a:  SIDA u otra enfermedad que afecte el sistema inmunolgico.  Tratamientos con drogas que afecten el sistema inmunolgico como los corticoides.  Tratamientos para el cncer como la radiacin o la quimioterapia.  Tienen una historia de cncer que haya afectado la mdula sea o el sistema linftico, como leucemia o linfoma.  Estn embarazadas o desean estarlo. Las que deseen quedar embarazadas no deben hacerlo hasta al menos 4 semanas luego de recibir esta vacuna. Las personas con enfermedades menores como un resfro, pueden vacunarse. Las personas que sufren enfermedades moderadas o graves deben esperar hasta su recuperacin antes de recibir la vacuna. Aqu se incluye a quienes presenten una temperatura de 101.3 F (38.5 C). CULES SON LOS RIESGOS DE LA VACUNA CONTRA LA CULEBRILLA?  Una vacuna, como cualquier medicamento, puede causar problemas serios, como por ejemplo reacciones alrgicas graves. Sin embargo, el riesgo de que cualquier vacuna cause un dao grave, o la muerte, es extremadamente pequeo.  No se han asociado reacciones de importancia a estas vacunas. Problemas leves  Enrojecimiento, dolor, hinchazn, o picazn en el lugar de la inyeccin (en 1 de cada 3 personas).  Cefalea (en aproximadamente 1 de cada 70 personas). Como todas las vacunas, esta vacuna sigue siendo controlada para hallar problemas   infrecuentes o graves. QU DEBO HACER EN CASO DE UNA REACCIN MODERADA O GRAVE? Qu debo observar? Cualquier estado anormal como una reaccin alrgica grave o fiebre alta. Si ocurre una reaccin alrgica  grave, ocurrir entre unos pocos minutos hasta una hora despus de la aplicacin de la inyeccin. Signos de reaccin alrgica grave que presente dificultad para respirar, debilidad, ronquera o silbidos, ritmo cardaco acelerado, ronchas, mareos, palidez, o hinchazn de la garganta.  Qu debo hacer?  Comunquese con el profesional que lo asiste o lleve inmediatamente a la persona al mdico.  Cuntele al mdico lo que ha sucedido, la fecha y momento en el que ocurri y cundo se aplic la vacuna.  Pdale a su mdico, enfermera o el departamento de salud que informen la reaccin llenando el formulario de Vaccine Adverse Event Report (Informe de reacciones adversas a las vacunas - VAERS). O, puede enviar este informe a travs de la pgina web del VAERS al http://www.vaers.hhs.gov o llamando al 1-800-822-7967. El VAERS no proporciona orientacin mdica. CMO PUEDO SABER MS?  El profesional podr darle el prospecto de la vacuna o sugerirle otras fuentes de informacin.  Comunquese con los Centers for Disease Control and Prevention (Centros para el control y la prevencin de enfermedades, CDC).  Llame al 1-800-232-4636 (1-800-CDC-INFO).  Visite los sitios web de CDC ubicados enhttp://www.cdc.gov/vaccines. CDC Shingles Vaccine-Spanish VIS (02/03/08) Document Released: 10/03/2007 Document Revised: 07/09/2011 ExitCare Patient Information 2015 [employer]. This information is not intended to replace advice given to you by your health care provider. Make sure you discuss any questions you have with your health care provider.  

## 2014-02-24 NOTE — Progress Notes (Signed)
Andrea Kirby Nov 08, 1933 [id number]   History:    80 y.o.  For GYN follow-up exam.Review of her records indicated the following:  1982 patient had total abdominal hysterectomy with bilateral salpingo-oophorectomy for endometriosis  Left modified radical mastectomy with TRAM reconstruction July 1998 for a 5.5 cm invasive ductal carcinoma involving 1 out of 14 lymph nodes, estrogen and progesterone receptor negative, treated with doxorubicin x4, paclitaxel x4, then CMF x4, then radiation. All treatment was completed July 1999.  Right mastectomy and sentinel lymph node dissection with implant reconstruction July 2006 for ductal carcinoma in situ. This tumor was estrogen receptor positive, PR negative.  Patient has been followed by Dr. Jeanie Sewer medical oncologist.  Patient was seen by my partner on 02/14/2012 for followup ultrasound as a result of an adnexal cyst after bilateral salpingo-oophorectomy. The differential includes a peritoneal inclusion cyst or an ovarian remnant. On ultrasound today her uterus is surgically absent. Her right adnexa is negative without an ovary. In the left adnexa there was an echo-free thin walled cyst of 3.7 cm. It is unchanged from last year in appearance and size. We have been aware of a cyst since 2004. At that time it was 2.4 cm. It has slowly grown over not since 2007. There is no evidence of ovarian remnant. Patient did not return for follow-up ultrasound. Patient did have a past history of total abdominal hysterectomy with bilateral salpingo-oophorectomy.  Patient's last bone density study was done this year stable from previous study and in the osteopenic range.Patient's brother had colon cancer. Patient's last colonoscopy was in  2015 by Dr. Auburn Bilberry which was negative. Although she did have several years ago history of colon polyps. She is on a 5 year cycle recall.  Greater than 30 years ago patient had cervical dysplasia and was treated by Dr.  Wynetta Emery with conization. She has had normal Pap smears since then.   Past medical history,surgical history, family history and social history were all reviewed and documented in the EPIC chart.  Gynecologic History No LMP recorded. Patient has had a hysterectomy. Contraception: status post hysterectomy Last Pap: 2014. Results were: normal Last mammogram: 2014. Results were: normal  Obstetric History OB History  Gravida Para Term Preterm AB SAB TAB Ectopic Multiple Living  1 1 1       1     # Outcome Date GA Lbr Len/2nd Weight Sex Delivery Anes PTL Lv  1 TRM                ROS: A ROS was performed and pertinent positives and negatives are included in the history.  GENERAL: No fevers or chills. HEENT: No change in vision, no earache, sore throat or sinus congestion. NECK: No pain or stiffness. CARDIOVASCULAR: No chest pain or pressure. No palpitations. PULMONARY: No shortness of breath, cough or wheeze. GASTROINTESTINAL: No abdominal pain, nausea, vomiting or diarrhea, melena or bright red blood per rectum. GENITOURINARY: No urinary frequency, urgency, hesitancy or dysuria. MUSCULOSKELETAL: No joint or muscle pain, no back pain, no recent trauma. DERMATOLOGIC: No rash, no itching, no lesions. ENDOCRINE: No polyuria, polydipsia, no heat or cold intolerance. No recent change in weight. HEMATOLOGICAL: No anemia or easy bruising or bleeding. NEUROLOGIC: No headache, seizures, numbness, tingling or weakness. PSYCHIATRIC: No depression, no loss of interest in normal activity or change in sleep pattern.     Exam: chaperone present  BP 126/84  Ht 5' 0.5" (1.537 m)  Wt 167 lb (75.751 kg)  BMI 32.07  kg/m2  Body mass index is 32.07 kg/(m^2).  General appearance : Well developed well nourished female. No acute distress HEENT: Neck supple, trachea midline, no carotid bruits, no thyroidmegaly Lungs: Clear to auscultation, no rhonchi or wheezes, or rib retractions  Heart: Regular rate and  rhythm, no murmurs or gallops Breast:Examined in sitting and supine position were symmetrical in appearance, no palpable masses or tenderness,  no skin retraction, no nipple inversion, no nipple discharge, no skin discoloration, no axillary or supraclavicular lymphadenopathy Abdomen: no palpable masses or tenderness, no rebound or guarding Extremities: no edema or skin discoloration or tenderness  Pelvic:  Bartholin, Urethra, Skene Glands: Within normal limits             Vagina: No gross lesions or discharge  Cervix: Absent  Uterus  absent  Adnexa  Without masses or tenderness  Anus and perineum  normal   Rectovaginal  normal sphincter tone without palpated masses or tenderness             Hemoccult PCP provides     Assessment/Plan:  80 y.o. female with past history of breast cancer has been followed by the GYN oncologist. No Pap smear done according to the new guidelines. We discussed importance of calcium vitamin D and regular exercise for osteoporosis prevention. Dr. Linna Darner her PCP we'll be doing her blood work. Patient was provided with a prescription for her to obtain her shingles vaccine at her local pharmacy. All her other vaccines are up-to-date. Patient will be scheduled for an ultrasound for follow-up on the adnexal cyst.  Terrance Mass MD, 11:37 AM 02/24/2014

## 2014-03-01 ENCOUNTER — Encounter: Payer: Self-pay | Admitting: Gynecology

## 2014-07-09 ENCOUNTER — Telehealth: Payer: Self-pay

## 2014-07-12 NOTE — Telephone Encounter (Signed)
Patient

## 2014-10-04 ENCOUNTER — Other Ambulatory Visit: Payer: BLUE CROSS/BLUE SHIELD

## 2014-10-04 ENCOUNTER — Ambulatory Visit: Payer: BLUE CROSS/BLUE SHIELD | Admitting: Gynecology

## 2014-10-05 ENCOUNTER — Other Ambulatory Visit: Payer: Self-pay | Admitting: Gynecology

## 2014-10-05 DIAGNOSIS — N838 Other noninflammatory disorders of ovary, fallopian tube and broad ligament: Secondary | ICD-10-CM

## 2014-10-05 DIAGNOSIS — N83201 Unspecified ovarian cyst, right side: Secondary | ICD-10-CM

## 2014-10-06 ENCOUNTER — Encounter: Payer: Self-pay | Admitting: Gynecology

## 2014-10-06 ENCOUNTER — Other Ambulatory Visit: Payer: Self-pay | Admitting: Gynecology

## 2014-10-06 ENCOUNTER — Ambulatory Visit (INDEPENDENT_AMBULATORY_CARE_PROVIDER_SITE_OTHER): Payer: Medicare Other | Admitting: Gynecology

## 2014-10-06 ENCOUNTER — Ambulatory Visit (INDEPENDENT_AMBULATORY_CARE_PROVIDER_SITE_OTHER): Payer: Medicare Other

## 2014-10-06 VITALS — BP 128/80 | Ht 60.0 in | Wt 167.0 lb

## 2014-10-06 DIAGNOSIS — IMO0002 Reserved for concepts with insufficient information to code with codable children: Secondary | ICD-10-CM

## 2014-10-06 DIAGNOSIS — N949 Unspecified condition associated with female genital organs and menstrual cycle: Secondary | ICD-10-CM | POA: Diagnosis not present

## 2014-10-06 DIAGNOSIS — N83201 Unspecified ovarian cyst, right side: Secondary | ICD-10-CM

## 2014-10-06 NOTE — Progress Notes (Signed)
   Patient is a 81 year old who presented to the office today for her annual ultrasound to follow-up from what appears to be a long-standing peritoneal inclusion cyst. Patient is otherwise asymptomatic. Her history is as follows:  1982 patient had total abdominal hysterectomy with bilateral salpingo-oophorectomy for endometriosis  Left modified radical mastectomy with TRAM reconstruction July 1998 for a 5.5 cm invasive ductal carcinoma involving 1 out of 14 lymph nodes, estrogen and progesterone receptor negative, treated with doxorubicin x4, paclitaxel x4, then CMF x4, then radiation. All treatment was completed July 1999.  Right mastectomy and sentinel lymph node dissection with implant reconstruction July 2006 for ductal carcinoma in situ. This tumor was estrogen receptor positive, PR negative.  Patient has been followed by Dr. Jeanie Sewer medical oncologist.  Patient was seen by my partner on 02/14/2012 for followup ultrasound as a result of an adnexal cyst after bilateral salpingo-oophorectomy. The differential includes a peritoneal inclusion cyst or an ovarian remnant. On ultrasound today her uterus is surgically absent. Her right adnexa is negative without an ovary. In the left adnexa there was an echo-free thin walled cyst of 3.7 cm. It is unchanged from last year in appearance and size. We have been aware of a cyst since 2004. At that time it was 2.4 cm. It has slowly grown over not since 2007. There is no evidence of ovarian remnant. Patient did not return for follow-up ultrasound. Patient did have a past history of total abdominal hysterectomy with bilateral salpingo-oophorectomy.  Ultrasound today: Absent uterus. Right and left ovary absent. Right adnexal negative left adnexa continued presence of echo-free thinwall cyst measuring 3.9 x 3.4 x 3.1 cm average size 3.5 cm avascular no fluid otherwise in the cul-de-sac.  Assessment/plan: Patient with persistent peritoneal cyst which has  been present since 2004 no change in size since 2007. Patient was reassured. A CA-125 will be drawn today as limitations were discussed with the patient. I think we continued to follow with annual ultrasound unless she becomes symptomatic is any surgical intervention recommended. Patient has a follow-up appointment with Dr. Jana Hakim in July.

## 2014-10-07 LAB — CA 125: CA 125: 7 U/mL (ref <35)

## 2014-10-18 ENCOUNTER — Other Ambulatory Visit: Payer: Self-pay | Admitting: Emergency Medicine

## 2014-10-18 MED ORDER — ATORVASTATIN CALCIUM 10 MG PO TABS: 10.0000 mg | ORAL_TABLET | ORAL | Status: DC

## 2014-10-25 ENCOUNTER — Other Ambulatory Visit: Payer: Self-pay

## 2014-11-10 ENCOUNTER — Other Ambulatory Visit: Payer: Self-pay | Admitting: *Deleted

## 2014-11-10 DIAGNOSIS — C50919 Malignant neoplasm of unspecified site of unspecified female breast: Secondary | ICD-10-CM

## 2014-11-11 ENCOUNTER — Other Ambulatory Visit (HOSPITAL_BASED_OUTPATIENT_CLINIC_OR_DEPARTMENT_OTHER): Payer: Medicare Other

## 2014-11-11 ENCOUNTER — Telehealth: Payer: Self-pay | Admitting: Oncology

## 2014-11-11 ENCOUNTER — Ambulatory Visit (HOSPITAL_BASED_OUTPATIENT_CLINIC_OR_DEPARTMENT_OTHER): Payer: Medicare Other | Admitting: Oncology

## 2014-11-11 VITALS — BP 146/67 | HR 70 | Temp 97.9°F | Resp 18 | Ht 60.0 in | Wt 169.6 lb

## 2014-11-11 DIAGNOSIS — Z853 Personal history of malignant neoplasm of breast: Secondary | ICD-10-CM

## 2014-11-11 DIAGNOSIS — C50911 Malignant neoplasm of unspecified site of right female breast: Secondary | ICD-10-CM

## 2014-11-11 DIAGNOSIS — C50919 Malignant neoplasm of unspecified site of unspecified female breast: Secondary | ICD-10-CM

## 2014-11-11 DIAGNOSIS — C50912 Malignant neoplasm of unspecified site of left female breast: Secondary | ICD-10-CM

## 2014-11-11 LAB — CBC WITH DIFFERENTIAL/PLATELET
BASO%: 0.6 % (ref 0.0–2.0)
BASOS ABS: 0.1 10*3/uL (ref 0.0–0.1)
EOS%: 1.5 % (ref 0.0–7.0)
Eosinophils Absolute: 0.1 10*3/uL (ref 0.0–0.5)
HCT: 40.9 % (ref 34.8–46.6)
HGB: 13.5 g/dL (ref 11.6–15.9)
LYMPH#: 2.3 10*3/uL (ref 0.9–3.3)
LYMPH%: 25.2 % (ref 14.0–49.7)
MCH: 30.3 pg (ref 25.1–34.0)
MCHC: 33 g/dL (ref 31.5–36.0)
MCV: 91.7 fL (ref 79.5–101.0)
MONO#: 0.6 10*3/uL (ref 0.1–0.9)
MONO%: 6.9 % (ref 0.0–14.0)
NEUT%: 65.8 % (ref 38.4–76.8)
NEUTROS ABS: 6.1 10*3/uL (ref 1.5–6.5)
PLATELETS: 227 10*3/uL (ref 145–400)
RBC: 4.46 10*6/uL (ref 3.70–5.45)
RDW: 13.4 % (ref 11.2–14.5)
WBC: 9.2 10*3/uL (ref 3.9–10.3)

## 2014-11-11 LAB — COMPREHENSIVE METABOLIC PANEL (CC13)
ALT: 27 U/L (ref 0–55)
AST: 26 U/L (ref 5–34)
Albumin: 3.4 g/dL — ABNORMAL LOW (ref 3.5–5.0)
Alkaline Phosphatase: 69 U/L (ref 40–150)
Anion Gap: 8 mEq/L (ref 3–11)
BILIRUBIN TOTAL: 0.67 mg/dL (ref 0.20–1.20)
BUN: 18.9 mg/dL (ref 7.0–26.0)
CO2: 24 mEq/L (ref 22–29)
CREATININE: 0.9 mg/dL (ref 0.6–1.1)
Calcium: 8.8 mg/dL (ref 8.4–10.4)
Chloride: 108 mEq/L (ref 98–109)
EGFR: 64 mL/min/{1.73_m2} — AB (ref >90)
GLUCOSE: 99 mg/dL (ref 70–140)
Potassium: 4 mEq/L (ref 3.5–5.1)
SODIUM: 139 meq/L (ref 136–145)
Total Protein: 6.7 g/dL (ref 6.4–8.3)

## 2014-11-11 NOTE — Progress Notes (Signed)
ID: Harland German   DOB: [birth year]  MR#: [id number]  GUY#:[id number]  PCP: Unice Cobble, MD GYN: SU:  OTHER MD: Orlie Pollen M.D. [phone])  CHIEF COMPLAINT: Bilateral breast cancer  CURRENT TREATMENT: Observation  INTERVAL HISTORY: [facility] returns today for followup of her remote left-sided breast cancers. The interval history is chiefly significant for her finally having decided to move definitively to [place]. She is in the process of selling her house and is planning an Advertising account executive whether or not the house cells. This means she will be leaving town no later than September of this year.  REVIEW OF SYSTEMS: She is concerned about a change in the left breast area she wanted me to look at. She is experiencing some new pain in her right shoulder area, but she has been doing some painting in a lot of activities related to the moving. She may have a little bit of bursitis there. She gets short of breath sometimes when walking up stairs, but not otherwise. She bruises easily. She has aches and pains here and there and particularly her left knee is bothering her. She tells me her thyroid medication was recently adjusted sometimes her upper lids become irritated. A detailed review of systems was otherwise stable.  PAST MEDICAL HISTORY: Past Medical History  Diagnosis Date  . Hyperlipidemia   . CIN I (cervical intraepithelial neoplasia I)   . Atrophic vaginitis   . Adnexal mass     Left  . Hypertension   . Hypothyroidism   . Cancer     Breast cancer-Rt. DCIS-Left stage 3 invasive  . Endometriosis     Dr Cherylann Banas    PAST SURGICAL HISTORY: Past Surgical History  Procedure Laterality Date  . Mastectomy  1998 & 2006    Breast cancer R and L breast  . Bilateral salpingoophorectomy      endometrosis  . Colonoscopy w/ polypectomy      no polyp 2008  . Abdominal hysterectomy  1982    BSO  . Breast surgery  [id number]    Mastectomy X 2  . Breast surgery      Reconstruction   . Cesarean section    . Oophorectomy  1982    BSO  . Colposcopy    . Appendectomy      FAMILY HISTORY Family History  Problem Relation Age of Onset  . Heart disease Mother     CHF  . Diabetes Mother   . Hypertension Mother   . Lung cancer Father   . Diabetes Sister   . Hypertension Sister   . Emphysema Sister   . Cancer Brother     metastatic rectal cancer  . Hypertension Brother   . Aneurysm Brother     abdominal aneursym  . Stroke Paternal Aunt     X3 ; in 74s  . Stroke Paternal Uncle     2 in 2s  . Stroke Paternal Grandmother     in 3s  . Cancer Paternal Aunt     Stomach cancer    GYNECOLOGIC HISTORY: GX P1.  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 Her husband also was a professor. Their son, Bennett Scrape, lives and works in [place]. They have no grandchildren. 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     HEALTH MAINTENANCE: History  Substance Use Topics  . Smoking status: Never Smoker   . Smokeless tobacco: Not on file  . Alcohol Use: Yes     Comment: very rarely  Allergies  Allergen Reactions  . Neomycin     rash  . Prednisone   . Sulfonamide Derivatives     ? reaction    Current Outpatient Prescriptions  Medication Sig Dispense Refill  . atenolol (TENORMIN) 50 MG tablet TAKE 1/2  TABLET BY MOUTH TWICE DAILY 90 tablet 1  . atorvastatin (LIPITOR) 10 MG tablet Take 1 tablet (10 mg total) by mouth every other day. Pt will need OV and labs before further refills. 30 tablet 0  . BIOTIN PO Take 5,000 mg by mouth.     Marland Kitchen CALCIUM & MAGNESIUM CARBONATES PO Take 1,000 mg by mouth.    . cholecalciferol (VITAMIN D) 1000 UNITS tablet Take 5,000 Units by mouth daily.     . Multiple Vitamin (MULTIVITAMIN) tablet Take 1 tablet by mouth daily. EPA 735 mg    . Omega-3 Fatty Acids (FISH OIL) 1200 MG CAPS Take 165 mg by mouth daily. DHA    . SYNTHROID 50 MCG tablet Take 50 mcg by mouth daily.  1   No current facility-administered medications  for this visit.   Objective: Older white woman who appears stated age  30 Vitals:   11/11/14 1435  BP: 146/67  Pulse: 70  Temp: 97.9 F (36.6 C)  Resp: 18       Body mass index is 33.12 kg/(m^2).    ECOG FS: 1  Sclerae unicteric, EOMs intact Oropharynx clear, dentition in good repair No cervical or supraclavicular adenopathy Lungs no rales or rhonchi Heart regular rate and rhythm Abd soft, nontender, positive bowel sounds MSK no focal spinal tenderness, no upper extremity lymphedema Neuro: nonfocal, well oriented, positive affect Breasts: The right breast is status post mastectomy with reconstruction. There is no evidence of local recurrence. The right axilla is benign. The left breast is status post mastectomy and reconstruction. In the superior aspect, beyond the reconstructed breast, there is a 1.5 x 1.5 cm firm subcutaneous nodule which most likely represents scar, but which I do not recall palpating previously. Left axilla is benign.  LAB RESULTS: Lab Results  Component Value Date   WBC 9.2 11/11/2014   NEUTROABS 6.1 11/11/2014   HGB 13.5 11/11/2014   HCT 40.9 11/11/2014   MCV 91.7 11/11/2014   PLT 227 11/11/2014      Chemistry      Component Value Date/Time   NA 139 11/11/2014 1344   NA 136 11/13/2012 1137   K 4.0 11/11/2014 1344   K 4.2 11/13/2012 1137   CL 101 11/13/2012 1137   CO2 24 11/11/2014 1344   CO2 26 11/13/2012 1137   BUN 18.9 11/11/2014 1344   BUN 21 11/13/2012 1137   CREATININE 0.9 11/11/2014 1344   CREATININE 0.9 11/13/2012 1137      Component Value Date/Time   CALCIUM 8.8 11/11/2014 1344   CALCIUM 9.7 11/13/2012 1137   ALKPHOS 69 11/11/2014 1344   ALKPHOS 58 11/13/2012 1137   AST 26 11/11/2014 1344   AST 24 11/13/2012 1137   ALT 27 11/11/2014 1344   ALT 23 11/13/2012 1137   BILITOT 0.67 11/11/2014 1344   BILITOT 0.5 11/13/2012 1137       Lab Results  Component Value Date   LABCA2 12 02/08/2009    No components found for:  [id number]  No results for input(s): INR in the last 168 hours.  Urinalysis    Component Value Date/Time   COLORURINE YELLOW 12/08/2013 1155   APPEARANCEUR CLEAR 12/08/2013 1155   LABSPEC <=1.005* 12/08/2013  [place] 7.0 12/08/2013 1155   GLUCOSEU NEGATIVE 12/08/2013 1155   GLUCOSEU NEG 11/20/2012 1107   HGBUR NEGATIVE 12/08/2013 [place] 12/08/2013 1155   KETONESUR NEGATIVE 12/08/2013 1155   PROTEINUR NEG 11/20/2012 1107   UROBILINOGEN 0.2 12/08/2013 1155   NITRITE NEGATIVE 12/08/2013 1155   LEUKOCYTESUR NEGATIVE 12/08/2013 1155    STUDIES: No results found. .  ASSESSMENT: 81 y.o. [place] woman status post:  (1) Left modified radical mastectomy with TRAM reconstruction July 1998 for a 5.5 cm invasive ductal carcinoma involving 1 out of 14 lymph nodes, estrogen and progesterone receptor negative, treated with doxorubicin x4, paclitaxel x4, then CMF x4, then radiation.  All treatment was completed July 1999.  (2) Right mastectomy and sentinel lymph node dissection with implant reconstruction July 2006 for ductal carcinoma in situ.  This tumor was estrogen receptor positive, PR negative.   PLAN:  Circe is now 18 years out from her left-sided stage III breast cancer, with no evidence of recurrence. She does have an area of induration in the superior aspect of the reconstructed breast which I don't recall palpating previously. We are going to obtain an ultrasound to evaluate that further. I expect it will proved to be scar tissue.  She will likely move definitively to [place] in September. She likes her physicians there and she knows that she could "graduate" from follow-up here. Nevertheless at her request I made her return appointment here for a year from now  We did discuss her knee problem. If she lost a little weight that would help, but I do think she needs to rehab in any case . She does have an exercise bike, and my suggestion was that she do 20  minutes twice a day on a regular basis. Unfortunately she hates swimming (she has a perforated right tympanic membrane).  Overall I am delighted to see how well she is doing. She knows to call for any problems that may develop before her next visit here. Zaivion Kundrat C    11/11/2014

## 2014-11-11 NOTE — Telephone Encounter (Signed)
Gave avs& calendar for July 2017

## 2014-12-27 ENCOUNTER — Other Ambulatory Visit: Payer: Self-pay | Admitting: Oncology

## 2015-02-02 ENCOUNTER — Telehealth: Payer: Self-pay | Admitting: Oncology

## 2015-02-02 NOTE — Telephone Encounter (Signed)
Patient called in yesterday and left a message but message did not come through until today(voicemail issue melissa aware) She was stating that she has pain in the arm/side where she had surgery and would like a call

## 2015-02-08 ENCOUNTER — Telehealth: Payer: Self-pay | Admitting: Internal Medicine

## 2015-02-08 ENCOUNTER — Other Ambulatory Visit: Payer: Self-pay | Admitting: Internal Medicine

## 2015-02-08 DIAGNOSIS — M7541 Impingement syndrome of right shoulder: Secondary | ICD-10-CM

## 2015-02-08 NOTE — Telephone Encounter (Signed)
Is there a cancellation list this pt can be added to per [facility]? Please advise

## 2015-02-08 NOTE — Telephone Encounter (Signed)
Pt came by.    Dr Tamala Julian does not have a new pt apt until after the 20th.  She is moving on the 20th.  What should she do?    Best number [phone]

## 2015-02-10 NOTE — Telephone Encounter (Signed)
Spoke to pt, scheduled her for 02/16/15 @1230 .

## 2015-02-16 ENCOUNTER — Encounter: Payer: Self-pay | Admitting: Gynecology

## 2015-02-16 ENCOUNTER — Ambulatory Visit (INDEPENDENT_AMBULATORY_CARE_PROVIDER_SITE_OTHER): Payer: Medicare Other | Admitting: Family Medicine

## 2015-02-16 ENCOUNTER — Encounter: Payer: Self-pay | Admitting: Family Medicine

## 2015-02-16 ENCOUNTER — Other Ambulatory Visit (INDEPENDENT_AMBULATORY_CARE_PROVIDER_SITE_OTHER): Payer: Medicare Other

## 2015-02-16 ENCOUNTER — Ambulatory Visit (INDEPENDENT_AMBULATORY_CARE_PROVIDER_SITE_OTHER)
Admission: RE | Admit: 2015-02-16 | Discharge: 2015-02-16 | Disposition: A | Discharge status: Home / Self Care | Payer: Medicare Other | Source: Ambulatory Visit | Attending: Family Medicine | Admitting: Family Medicine

## 2015-02-16 ENCOUNTER — Ambulatory Visit (INDEPENDENT_AMBULATORY_CARE_PROVIDER_SITE_OTHER): Payer: Medicare Other | Admitting: Gynecology

## 2015-02-16 VITALS — BP 134/88 | Ht 60.0 in | Wt 167.0 lb

## 2015-02-16 VITALS — BP 126/74 | HR 76 | Ht 60.0 in | Wt 167.0 lb

## 2015-02-16 DIAGNOSIS — Z1211 Encounter for screening for malignant neoplasm of colon: Secondary | ICD-10-CM | POA: Diagnosis not present

## 2015-02-16 DIAGNOSIS — M25511 Pain in right shoulder: Secondary | ICD-10-CM

## 2015-02-16 DIAGNOSIS — M12811 Other specific arthropathies, not elsewhere classified, right shoulder: Secondary | ICD-10-CM

## 2015-02-16 DIAGNOSIS — Z01419 Encounter for gynecological examination (general) (routine) without abnormal findings: Secondary | ICD-10-CM

## 2015-02-16 DIAGNOSIS — K921 Melena: Secondary | ICD-10-CM

## 2015-02-16 DIAGNOSIS — M8580 Other specified disorders of bone density and structure, unspecified site: Secondary | ICD-10-CM | POA: Diagnosis not present

## 2015-02-16 DIAGNOSIS — N949 Unspecified condition associated with female genital organs and menstrual cycle: Secondary | ICD-10-CM | POA: Diagnosis not present

## 2015-02-16 DIAGNOSIS — IMO0002 Reserved for concepts with insufficient information to code with codable children: Secondary | ICD-10-CM

## 2015-02-16 DIAGNOSIS — M75101 Unspecified rotator cuff tear or rupture of right shoulder, not specified as traumatic: Secondary | ICD-10-CM

## 2015-02-16 NOTE — Progress Notes (Signed)
Andrea Kirby [birth year] [id number]   History:    81 y.o.  for annual gyn exam  Who stated that today and several days ago when she wiped she noted some streaks of blood on tissue paper. She denies any constipation. She had a colonoscopy in 2015 which she reported was normal. She does have past history of colon polyps. Also we have been following for several years a small pocket of fluid that appears to be like a cyst and is more than likely a pelvic inclusion cyst. Her history as follows:  1982 patient had total abdominal hysterectomy with bilateral salpingo-oophorectomy for endometriosis  Left modified radical mastectomy with TRAM reconstruction July 1998 for a 5.5 cm invasive ductal carcinoma involving 1 out of 14 lymph nodes, estrogen and progesterone receptor negative, treated with doxorubicin x4, paclitaxel x4, then CMF x4, then radiation. All treatment was completed July 1999.  Right mastectomy and sentinel lymph node dissection with implant reconstruction July 2006 for ductal carcinoma in situ. This tumor was estrogen receptor positive, PR negative.  Patient has been followed by Dr. Jeanie Sewer medical oncologist.  Patient was seen by my partner on 02/14/2012 for followup ultrasound as a result of an adnexal cyst after bilateral salpingo-oophorectomy. The differential includes a peritoneal inclusion cyst or an ovarian remnant. On ultrasound today her uterus is surgically absent. Her right adnexa is negative without an ovary. In the left adnexa there was an echo-free thin walled cyst of 3.7 cm. It is unchanged from last year in appearance and size. We have been aware of a cyst since 2004. At that time it was 2.4 cm. It has slowly grown over not since 2007. There is no evidence of ovarian remnant. Patient did not return for follow-up ultrasound. Patient did have a past history of total abdominal hysterectomy with bilateral salpingo-oophorectomy.  Ultrasound June 2016:: Absent  uterus. Right and left ovary absent. Right adnexal negative left adnexa continued presence of echo-free thinwall cyst measuring 3.9 x 3.4 x 3.1 cm average size 3.5 cm avascular no fluid otherwise in the cul-de-sac. CA 125 was 7  Patient's bone density study in 2015 indicated osteopenia (bilateral femoral neck -1.9 T score with normal Frax analysis)  Greater than 30 years ago patient had cervical dysplasia and was treated by Dr. Wynetta Emery with conization. She has had normal Pap smears since then.  Past medical history,surgical history, family history and social history were all reviewed and documented in the EPIC chart.  Gynecologic History No LMP recorded. Patient has had a hysterectomy. Contraception: post menopausal status Last Pap: 2015 normal. Results were: normal Last mammogram: 2014. Results were: See above  Obstetric History OB History  Gravida Para Term Preterm AB SAB TAB Ectopic Multiple Living  1 1 1       1     # Outcome Date GA Lbr Len/2nd Weight Sex Delivery Anes PTL Lv  1 Term                ROS: A ROS was performed and pertinent positives and negatives are included in the history.  GENERAL: No fevers or chills. HEENT: No change in vision, no earache, sore throat or sinus congestion. NECK: No pain or stiffness. CARDIOVASCULAR: No chest pain or pressure. No palpitations. PULMONARY: No shortness of breath, cough or wheeze. GASTROINTESTINAL: No abdominal pain, nausea, vomiting or diarrhea, melena or bright red blood per rectum. GENITOURINARY: No urinary frequency, urgency, hesitancy or dysuria. MUSCULOSKELETAL: No joint or muscle pain, no back pain,  no recent trauma. DERMATOLOGIC: No rash, no itching, no lesions. ENDOCRINE: No polyuria, polydipsia, no heat or cold intolerance. No recent change in weight. HEMATOLOGICAL: No anemia or easy bruising or bleeding. NEUROLOGIC: No headache, seizures, numbness, tingling or weakness. PSYCHIATRIC: No depression, no loss of interest in normal  activity or change in sleep pattern.     Exam: chaperone present  BP 134/88 mmHg  Ht 5' (1.524 m)  Wt 167 lb (75.751 kg)  BMI 32.62 kg/m2  Body mass index is 32.62 kg/(m^2).  General appearance : Well developed well nourished female. No acute distress HEENT: Eyes: no retinal hemorrhage or exudates,  Neck supple, trachea midline, no carotid bruits, no thyroidmegaly Lungs: Clear to auscultation, no rhonchi or wheezes, or rib retractions  Heart: Regular rate and rhythm, no murmurs or gallops Breast:Examined in sitting and supine position were symmetrical in appearance, no palpable masses or tenderness,  no skin retraction, no nipple inversion, no nipple discharge, no skin discoloration, no axillary or supraclavicular lymphadenopathy Abdomen: no palpable masses or tenderness, no rebound or guarding Extremities: no edema or skin discoloration or tenderness  Pelvic:  Bartholin, Urethra, Skene Glands: Within normal limits             Vagina: No gross lesions or discharge, atrophic changes  Cervix: Absent  Uterus absent  Adnexa  Without masses or tenderness  Anus and perineum  normal   Rectovaginal  normal sphincter tone without palpated masses or tenderness             Hemoccult negative today.     Assessment/Plan:  81 y.o. female with past history of breast cancer has been followed by the GYN oncologist. No Pap smear done according to the new guidelines. We discussed importance of calcium vitamin D and regular exercise for osteoporosis prevention. Her PCP and oncologist of recently done her blood work. She is moving to [place] this week. We are going to give her copy of these office note as well as from her medical oncologist recent physical. Patient's vaccines are up-to-date last year she was provided with a prescription for the shingles vaccine. Pap smear not done today according to the new guidelines. She was instructed to continue to monitor her bowel movement habits. If she continues  to lose any further blood either while wiping and it will water she will make arrangements to be follow-up with her gastroenterologist. Pelvic inclusion cyst no further monitoring needed although she could be followed with an ultrasound yearly in [place] or sooner if symptomatic.Marland Kitchen    Terrance Mass MD, 10:40 AM 02/16/2015

## 2015-02-16 NOTE — Progress Notes (Signed)
Pre visit review using our clinic review tool, if applicable. No additional management support is needed unless otherwise documented below in the visit note. 

## 2015-02-16 NOTE — Progress Notes (Signed)
[facility] [place], [place] [address] Phone: [phone] Subjective:    I'm seeing this patient by the request  of:  Unice Cobble, MD   CC: right shoulder pain  [id number] Andrea Kirby Esther is a 81 y.o. female coming in with complaint of right shoulder pain. Patient statesbeen going on for weeks. Patient was packing a lot of boxes and moving. Patient is moving to [place] tomorrow. Having increasing pain. Patient states that there was a dull throbbing aching pain that does respond well to anti-inflammatories. Some radiation down the arm. No numbness. Mild weakness compared to contralateral side. No pain at night. Denies fevers chills or any abnormal weight loss. Been followed by oncology for history of left-sided breast cancer multiple years ago.  Past Medical History  Diagnosis Date  . Hyperlipidemia   . CIN I (cervical intraepithelial neoplasia I)   . Atrophic vaginitis   . Adnexal mass     Left  . Hypertension   . Hypothyroidism   . Cancer ([facility])     Breast cancer-Rt. DCIS-Left stage 3 invasive  . Endometriosis     Dr Cherylann Banas   Past Surgical History  Procedure Laterality Date  . Mastectomy  1998 & 2006    Breast cancer R and L breast  . Bilateral salpingoophorectomy      endometrosis  . Colonoscopy w/ polypectomy      no polyp 2008  . Abdominal hysterectomy  1982    BSO  . Breast surgery  [id number]    Mastectomy X 2  . Breast surgery      Reconstruction  . Cesarean section    . Oophorectomy  1982    BSO  . Colposcopy    . Appendectomy     Social History  Substance Use Topics  . Smoking status: Never Smoker   . Smokeless tobacco: None  . Alcohol Use: 0.0 oz/week    0 Standard drinks or equivalent per week     Comment: very rarely   Allergies  Allergen Reactions  . Neomycin     rash  . Prednisone   . Sulfonamide Derivatives     ? reaction   Family History  Problem Relation Age of Onset  . Heart  disease Mother     CHF  . Diabetes Mother   . Hypertension Mother   . Lung cancer Father   . Diabetes Sister   . Hypertension Sister   . Emphysema Sister   . Cancer Brother     metastatic rectal cancer  . Hypertension Brother   . Aneurysm Brother     abdominal aneursym  . Stroke Paternal Aunt     X3 ; in 54s  . Stroke Paternal Uncle     2 in 38s  . Stroke Paternal Grandmother     in 81s  . Cancer Paternal Aunt     Stomach cancer        Past medical history, social, surgical and family history all reviewed in electronic medical record.   Review of Systems: No headache, visual changes, nausea, vomiting, diarrhea, constipation, dizziness, abdominal pain, skin rash, fevers, chills, night sweats, weight loss, swollen lymph nodes, body aches, joint swelling, muscle aches, chest pain, shortness of breath, mood changes.   Objective Blood pressure 126/74, pulse 76, height 5' (1.524 m), weight 167 lb (75.751 kg), SpO2 98 %.  General: No apparent distress alert and oriented x3 mood and affect normal, dressed appropriately.  HEENT: Pupils  equal, extraocular movements intact  Respiratory: Patient's speak in full sentences and does not appear short of breath  Cardiovascular: No lower extremity edema, non tender, no erythema  Skin: Warm dry intact with no signs of infection or rash on extremities or on axial skeleton.  Abdomen: Soft nontender  Neuro: Cranial nerves II through XII are intact, neurovascularly intact in all extremities with 2+ DTRs and 2+ pulses.  Lymph: No lymphadenopathy of posterior or anterior cervical chain or axillae bilaterally.  Gait normal with good balance and coordination.  MSK:  Non tender with full range of motion and good stability and symmetric strength and tone of , elbows, wrist, hip, knee and ankles bilaterally.  Shoulder: Right Inspection reveals no abnormalities, atrophy or asymmetry. Palpation is normal with no tenderness over AC joint or bicipital  groove. ROM is full in all planes passively. Rotator cuff strength 4 out of 5 compared to the contralateral side signs of impingement with positive Neer and Hawkin's tests, but negative empty can sign. Speeds and Yergason's tests normal. No labral pathology noted with negative Obrien's, negative clunk and good stability. Normal scapular function observed. No painful arc and no drop arm sign. No apprehension sign Hunter lateral shoulder unremarkable  MSK US performed of: Right This study was ordered, performed, and interpreted by Charlann Boxer D.O.  Shoulder:   Supraspinatus:  Tear knee noted when she is near full-thickness but does have scar tissue formation. No retraction noted. Infraspinatus:  Appears normal on long and transverse views. Significant increase in Doppler flow Subscapularis:  Appears normal on long and transverse views. Positive bursa Teres Minor:  Appears normal on long and transverse views. AC joint:  Moderate arthritis Glenohumeral Joint: moderate arthritis Glenoid Labrum:  Intact without visualized tears. Biceps Tendon: positive target sign  Impression: supraspinatus tear with scar tissue formation and underlying moderate arthritis.  Procedure: Real-time Ultrasound Guided Injection of right glenohumeral joint Device: GE Logiq E  Ultrasound guided injection is preferred based studies that show increased duration, increased effect, greater accuracy, decreased procedural pain, increased response rate with ultrasound guided versus blind injection.  Verbal informed consent obtained.  Time-out conducted.  Noted no overlying erythema, induration, or other signs of local infection.  Skin prepped in a sterile fashion.  Local anesthesia: Topical Ethyl chloride.  With sterile technique and under real time ultrasound guidance:  Joint visualized.  23g 1  inch needle inserted posterior approach. Pictures taken for needle placement. Patient did have injection of 2 cc of 1%  lidocaine, 2 cc of 0.5% Marcaine, and 1.0 cc of Kenalog 40 mg/dL. Completed without difficulty  Pain immediately resolved suggesting accurate placement of the medication.  Advised to call if fevers/chills, erythema, induration, drainage, or persistent bleeding.  Images permanently stored and available for review in the ultrasound unit.  Impression: Technically successful ultrasound guided injection.  [id number]; 15 minutes spent for Therapeutic exercises as stated in above notes.  This included exercises focusing on stretching, strengthening, with significant focus on eccentric aspects.  Basic scapular stabilization to include adduction and depression of scapula Scaption, focusing on proper movement and good control Internal and External rotation utilizing a theraband, with elbow tucked at side entire time Rows with theraband Proper technique shown and discussed handout in great detail with ATC.  All questions were discussed and answered.     Impression and Recommendations:     This case required medical decision making of moderate complexity.

## 2015-02-16 NOTE — Patient Instructions (Addendum)
Very nice to meet you Have a safe move.  Get xray downstairs today on your way out.  Ice 20 minutes 2 times daily. Usually after activity and before bed. Exercises 3 times a week.  Avoid overhead activity when possible and keep hands within peripheral vision.  Vitamin D 2000 IU daily See me if you come back and if you need me.

## 2015-02-16 NOTE — Assessment & Plan Note (Signed)
Patient does have a tear of the rotator cuff but does have good strength. Patient has no retraction. Patient has a good chance of healing well. Discuss to allow some medial to do the lifting with her move. We discussed icing protocol, patient had an injection today and tolerated the procedure well. We discussed which activities to potentially do an which ones to avoid. Patient and will come back and see me again if she comes back in town. X-rays for shoulder ordered today to rule out any other any abnormality with patient's history of breast cancer.

## 2015-04-20 ENCOUNTER — Telehealth: Payer: Self-pay

## 2015-04-20 NOTE — Telephone Encounter (Signed)
Left message advising patient to call back to schedule nurse visit for flu vaccine or can give flu vaccine info if already recd

## 2015-06-28 NOTE — Telephone Encounter (Signed)
My chart message sent to pt.

## 2015-11-09 ENCOUNTER — Other Ambulatory Visit: Payer: Self-pay | Admitting: *Deleted

## 2015-11-09 DIAGNOSIS — C50911 Malignant neoplasm of unspecified site of right female breast: Secondary | ICD-10-CM

## 2015-11-09 DIAGNOSIS — C50912 Malignant neoplasm of unspecified site of left female breast: Principal | ICD-10-CM

## 2015-11-10 ENCOUNTER — Telehealth: Payer: Self-pay | Admitting: Oncology

## 2015-11-10 ENCOUNTER — Other Ambulatory Visit (HOSPITAL_BASED_OUTPATIENT_CLINIC_OR_DEPARTMENT_OTHER): Payer: Medicare Other

## 2015-11-10 ENCOUNTER — Ambulatory Visit (HOSPITAL_BASED_OUTPATIENT_CLINIC_OR_DEPARTMENT_OTHER): Payer: Medicare Other | Admitting: Oncology

## 2015-11-10 VITALS — BP 153/80 | HR 67 | Temp 97.9°F | Resp 18 | Ht 60.0 in | Wt 168.4 lb

## 2015-11-10 DIAGNOSIS — C50911 Malignant neoplasm of unspecified site of right female breast: Secondary | ICD-10-CM

## 2015-11-10 DIAGNOSIS — Z853 Personal history of malignant neoplasm of breast: Secondary | ICD-10-CM

## 2015-11-10 DIAGNOSIS — C50912 Malignant neoplasm of unspecified site of left female breast: Principal | ICD-10-CM

## 2015-11-10 LAB — CBC WITH DIFFERENTIAL/PLATELET
BASO%: 0.4 % (ref 0.0–2.0)
Basophils Absolute: 0 10*3/uL (ref 0.0–0.1)
EOS ABS: 0.1 10*3/uL (ref 0.0–0.5)
EOS%: 1.4 % (ref 0.0–7.0)
HEMATOCRIT: 40.5 % (ref 34.8–46.6)
HEMOGLOBIN: 13.7 g/dL (ref 11.6–15.9)
LYMPH#: 2.3 10*3/uL (ref 0.9–3.3)
LYMPH%: 25.2 % (ref 14.0–49.7)
MCH: 30.7 pg (ref 25.1–34.0)
MCHC: 33.8 g/dL (ref 31.5–36.0)
MCV: 90.8 fL (ref 79.5–101.0)
MONO#: 0.7 10*3/uL (ref 0.1–0.9)
MONO%: 7.7 % (ref 0.0–14.0)
NEUT%: 65.3 % (ref 38.4–76.8)
NEUTROS ABS: 6 10*3/uL (ref 1.5–6.5)
PLATELETS: 234 10*3/uL (ref 145–400)
RBC: 4.46 10*6/uL (ref 3.70–5.45)
RDW: 13.8 % (ref 11.2–14.5)
WBC: 9.2 10*3/uL (ref 3.9–10.3)

## 2015-11-10 LAB — COMPREHENSIVE METABOLIC PANEL
ALBUMIN: 3.6 g/dL (ref 3.5–5.0)
ALT: 26 U/L (ref 0–55)
ANION GAP: 7 meq/L (ref 3–11)
AST: 27 U/L (ref 5–34)
Alkaline Phosphatase: 68 U/L (ref 40–150)
BILIRUBIN TOTAL: 0.73 mg/dL (ref 0.20–1.20)
BUN: 21.6 mg/dL (ref 7.0–26.0)
CALCIUM: 9.6 mg/dL (ref 8.4–10.4)
CO2: 28 meq/L (ref 22–29)
CREATININE: 0.9 mg/dL (ref 0.6–1.1)
Chloride: 104 mEq/L (ref 98–109)
EGFR: 57 mL/min/{1.73_m2} — AB (ref >90)
Glucose: 104 mg/dl (ref 70–140)
Potassium: 4.4 mEq/L (ref 3.5–5.1)
Sodium: 139 mEq/L (ref 136–145)
TOTAL PROTEIN: 7.3 g/dL (ref 6.4–8.3)

## 2015-11-10 NOTE — Telephone Encounter (Signed)
per pof to sch pt appt-gave pt copy of avs °

## 2015-11-10 NOTE — Progress Notes (Signed)
ID: Andrea Kirby   DOB: June 17, 1933  MR#: [id number]  [id number]  PCP: Andrea Cobble, MD GYN: SU:  OTHER MD: Andrea Kirby M.D. [phone])  CHIEF COMPLAINT: Bilateral breast cancer  CURRENT TREATMENT: Observation  INTERVAL HISTORY: Andrea Kirby returns today for followup of her remote estrogen receptor positive breast cancers. The interval history is unremarkable. She gives me no symptoms suggestive of disease recurrence.  REVIEW OF SYSTEMS: She [place] living in the villages in [place]. She is not exercising regularly. She takes a quarter mile walk (about 10 minutes she says) maybe 3 times a week. She has an exercise bike but forgets to use it. She describes herself is mildly fatigued. Her hearing aid works well. She had an episode of palpitations and her physician increased her metoprolol slightly which took care of that problem. She can be short of breath when walking up stairs but gets to the top without having to cause. She has stress urinary incontinence which is chronic. She bruises easily. She feels forgetful. Otherwise a detailed review of systems today was stable  PAST MEDICAL HISTORY: Past Medical History  Diagnosis Date  . Hyperlipidemia   . CIN I (cervical intraepithelial neoplasia I)   . Atrophic vaginitis   . Adnexal mass     Left  . Hypertension   . Hypothyroidism   . Cancer ([facility])     Breast cancer-Rt. DCIS-Left stage 3 invasive  . Endometriosis     Dr Andrea Kirby    PAST SURGICAL HISTORY: Past Surgical History  Procedure Laterality Date  . Mastectomy  1998 & 2006    Breast cancer R and L breast  . Bilateral salpingoophorectomy      endometrosis  . Colonoscopy w/ polypectomy      no polyp 2008  . Abdominal hysterectomy  1982    BSO  . Breast surgery  [id number]    Mastectomy X 2  . Breast surgery      Reconstruction  . Cesarean section    . Oophorectomy  1982    BSO  . Colposcopy    . Appendectomy      FAMILY HISTORY Family History  Problem  Relation Age of Onset  . Heart disease Mother     CHF  . Diabetes Mother   . Hypertension Mother   . Lung cancer Father   . Diabetes Sister   . Hypertension Sister   . Emphysema Sister   . Cancer Brother     metastatic rectal cancer  . Hypertension Brother   . Aneurysm Brother     abdominal aneursym  . Stroke Paternal Aunt     X3 ; in 84s  . Stroke Paternal Uncle     2 in 50s  . Stroke Paternal Grandmother     in 59s  . Cancer Paternal Aunt     Stomach cancer    GYNECOLOGIC HISTORY: GX P1.  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 Her husband Andrea Kirby also was a professor. He is 80 years old as of July 2017 Their son, Andrea Kirby, lives and works in [place]. They have no grandchildren. 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     HEALTH MAINTENANCE: Social History  Substance Use Topics  . Smoking status: Never Smoker   . Smokeless tobacco: Not on file  . Alcohol Use: 0.0 oz/week    0 Standard drinks or equivalent per week     Comment: very rarely     Allergies  Allergen Reactions  . Neomycin  rash  . Prednisone   . Sulfonamide Derivatives     ? reaction    Current Outpatient Prescriptions  Medication Sig Dispense Refill  . atenolol (TENORMIN) 50 MG tablet TAKE 1/2  TABLET BY MOUTH TWICE DAILY 90 tablet 1  . atorvastatin (LIPITOR) 10 MG tablet TAKE 1 TABLET BY MOUTH EVERY OTHER DAY 45 tablet 0  . BIOTIN PO Take 5,000 mg by mouth.     Marland Kitchen CALCIUM & MAGNESIUM CARBONATES PO Take 1,000 mg by mouth.    . cholecalciferol (VITAMIN D) 1000 UNITS tablet Take 5,000 Units by mouth daily.     . Multiple Vitamin (MULTIVITAMIN) tablet Take 1 tablet by mouth daily. EPA 735 mg    . Omega-3 Fatty Acids (FISH OIL) 1200 MG CAPS Take 165 mg by mouth daily. DHA    . SYNTHROID 50 MCG tablet Take 50 mcg by mouth daily.  1   No current facility-administered medications for this visit.   Objective: Older white woman In no acute distress  Filed Vitals:   11/10/15  1400  BP: 153/80  Pulse: 67  Temp: 97.9 F (36.6 C)  Resp: 18       Body mass index is 32.89 kg/(m^2).    ECOG FS: 1  Sclerae unicteric, pupils round and equal Oropharynx clear and moist-- no thrush or other lesions No cervical or supraclavicular adenopathy Lungs no rales or rhonchi Heart regular rate and rhythm, no murmur appreciated Abd soft, nontender, positive bowel sounds MSK no focal spinal tenderness, chronic left upper extremity lymphedema, compression sleeve in place Neuro: nonfocal, well oriented, pleasant affect Breasts: Status post bilateral mastectomies with bilateral reconstruction. There is no evidence of local recurrence. Both axillae are benign.   LAB RESULTS: Lab Results  Component Value Date   WBC 9.2 11/10/2015   NEUTROABS 6.0 11/10/2015   HGB 13.7 11/10/2015   HCT 40.5 11/10/2015   MCV 90.8 11/10/2015   PLT 234 11/10/2015      Chemistry      Component Value Date/Time   NA 139 11/10/2015 1320   NA 136 11/13/2012 1137   K 4.4 11/10/2015 1320   K 4.2 11/13/2012 1137   CL 101 11/13/2012 1137   CO2 28 11/10/2015 1320   CO2 26 11/13/2012 1137   BUN 21.6 11/10/2015 1320   BUN 21 11/13/2012 1137   CREATININE 0.9 11/10/2015 1320   CREATININE 0.9 11/13/2012 1137      Component Value Date/Time   CALCIUM 9.6 11/10/2015 1320   CALCIUM 9.7 11/13/2012 1137   ALKPHOS 68 11/10/2015 1320   ALKPHOS 58 11/13/2012 1137   AST 27 11/10/2015 1320   AST 24 11/13/2012 1137   ALT 26 11/10/2015 1320   ALT 23 11/13/2012 1137   BILITOT 0.73 11/10/2015 1320   BILITOT 0.5 11/13/2012 1137       Lab Results  Component Value Date   LABCA2 12 02/08/2009    No components found for: [id number]  No results for input(s): INR in the last 168 hours.  Urinalysis    Component Value Date/Time   COLORURINE YELLOW 12/08/2013 1155   APPEARANCEUR CLEAR 12/08/2013 1155   LABSPEC <=1.005* 12/08/2013 1155   PHURINE 7.0 12/08/2013 1155   GLUCOSEU NEGATIVE 12/08/2013 1155    GLUCOSEU NEG 11/20/2012 1107   HGBUR NEGATIVE 12/08/2013 Andrea Kirby 12/08/2013 1155   KETONESUR NEGATIVE 12/08/2013 1155   PROTEINUR NEG 11/20/2012 1107   UROBILINOGEN 0.2 12/08/2013 1155   NITRITE NEGATIVE 12/08/2013 1155  LEUKOCYTESUR NEGATIVE 12/08/2013 1155    STUDIES: No results found. .  ASSESSMENT: 82 y.o. [place] woman status post:  (1) Left modified radical mastectomy with TRAM reconstruction July 1998 for a 5.5 cm invasive ductal carcinoma involving 1 out of 14 lymph nodes, estrogen and progesterone receptor negative, treated with doxorubicin x4, paclitaxel x4, then CMF x4, then radiation.  All treatment was completed July 1999.  (2) Right mastectomy and sentinel lymph node dissection with implant reconstruction July 2006 for ductal carcinoma in situ.  This tumor was estrogen receptor positive, PR negative.   PLAN:  Odetta is now 37 years out from definitive surgery for her first breast cancer, with no evidence of disease recurrence. This is very favorable. The second breast cancer, 11 years ago, was almost certainly cured with mastectomy  I think she could improve on her exercise program and I suggested 45 minutes 5 times a week. She can do all 45 minutes at one time or do 3:15 minute periods. Walking has the advantage that it carries her weight so it stimulates her bones. On bad weather days she can use her exercise bike.  We also talked about hydration. I recommended that she should 4 1/2 quarts of fluid daily. If she fails a quart bottle in the morning and drinks that by noon, she will have accomplished that with a minimum of concern  She is going to see me again in one year. She knows to call for any problems that may develop before that visit.  Lewanna Petrak C    11/10/2015

## 2016-05-21 ENCOUNTER — Ambulatory Visit (HOSPITAL_COMMUNITY)
Admission: RE | Admit: 2016-05-21 | Discharge: 2016-05-21 | Disposition: A | Discharge status: Home / Self Care | Payer: Medicare Other | Source: Ambulatory Visit | Attending: Oncology | Admitting: Oncology

## 2016-05-21 ENCOUNTER — Other Ambulatory Visit: Payer: Self-pay | Admitting: Oncology

## 2016-05-21 ENCOUNTER — Other Ambulatory Visit: Payer: Self-pay | Admitting: *Deleted

## 2016-05-21 DIAGNOSIS — I700 Atherosclerosis of aorta: Secondary | ICD-10-CM | POA: Diagnosis not present

## 2016-05-21 DIAGNOSIS — R52 Pain, unspecified: Secondary | ICD-10-CM | POA: Insufficient documentation

## 2016-05-21 DIAGNOSIS — R05 Cough: Secondary | ICD-10-CM | POA: Diagnosis present

## 2016-05-21 DIAGNOSIS — Z853 Personal history of malignant neoplasm of breast: Secondary | ICD-10-CM | POA: Diagnosis not present

## 2016-05-21 DIAGNOSIS — M4184 Other forms of scoliosis, thoracic region: Secondary | ICD-10-CM | POA: Insufficient documentation

## 2016-05-21 DIAGNOSIS — C50912 Malignant neoplasm of unspecified site of left female breast: Principal | ICD-10-CM

## 2016-05-21 DIAGNOSIS — M50323 Other cervical disc degeneration at C6-C7 level: Secondary | ICD-10-CM | POA: Diagnosis not present

## 2016-05-21 DIAGNOSIS — C50911 Malignant neoplasm of unspecified site of right female breast: Secondary | ICD-10-CM

## 2016-05-21 DIAGNOSIS — M50322 Other cervical disc degeneration at C5-C6 level: Secondary | ICD-10-CM | POA: Diagnosis not present

## 2016-05-21 DIAGNOSIS — M542 Cervicalgia: Secondary | ICD-10-CM | POA: Diagnosis present

## 2016-05-21 DIAGNOSIS — M546 Pain in thoracic spine: Secondary | ICD-10-CM | POA: Diagnosis present

## 2016-05-21 DIAGNOSIS — M5134 Other intervertebral disc degeneration, thoracic region: Secondary | ICD-10-CM | POA: Insufficient documentation

## 2016-09-12 ENCOUNTER — Encounter: Payer: Self-pay | Admitting: Gynecology

## 2016-11-14 ENCOUNTER — Other Ambulatory Visit: Payer: Medicare Other

## 2016-11-14 ENCOUNTER — Ambulatory Visit: Payer: Medicare Other | Admitting: Oncology

## 2017-07-07 IMAGING — DX DG CHEST 2V
2 series · 2 of 2 positions shown · non-contrast
Comparison: February 13, 2013

CLINICAL DATA: Shortness of breath and cough. History of breast
carcinoma

EXAM:
CHEST  2 VIEW

[chest pa]
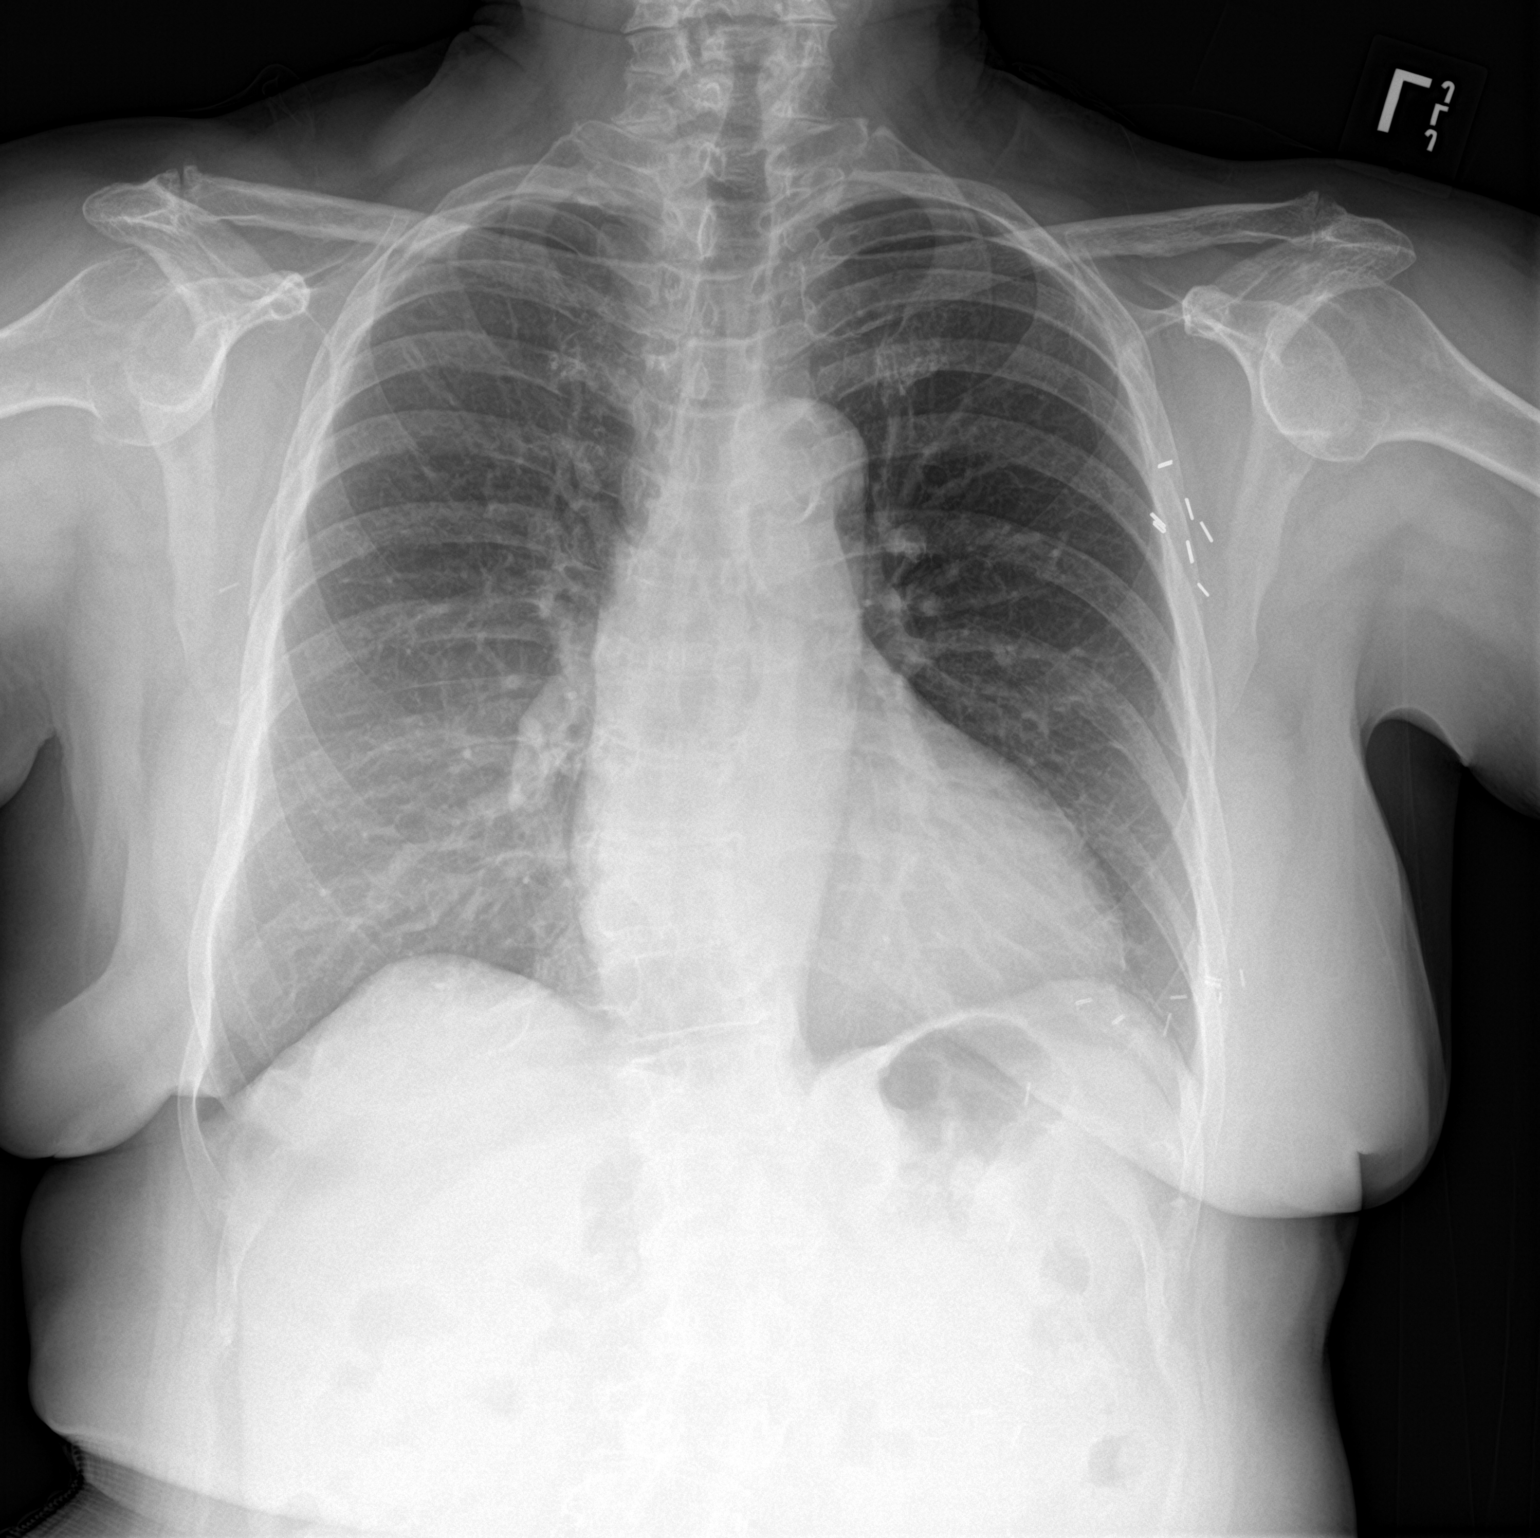

[chest lat]
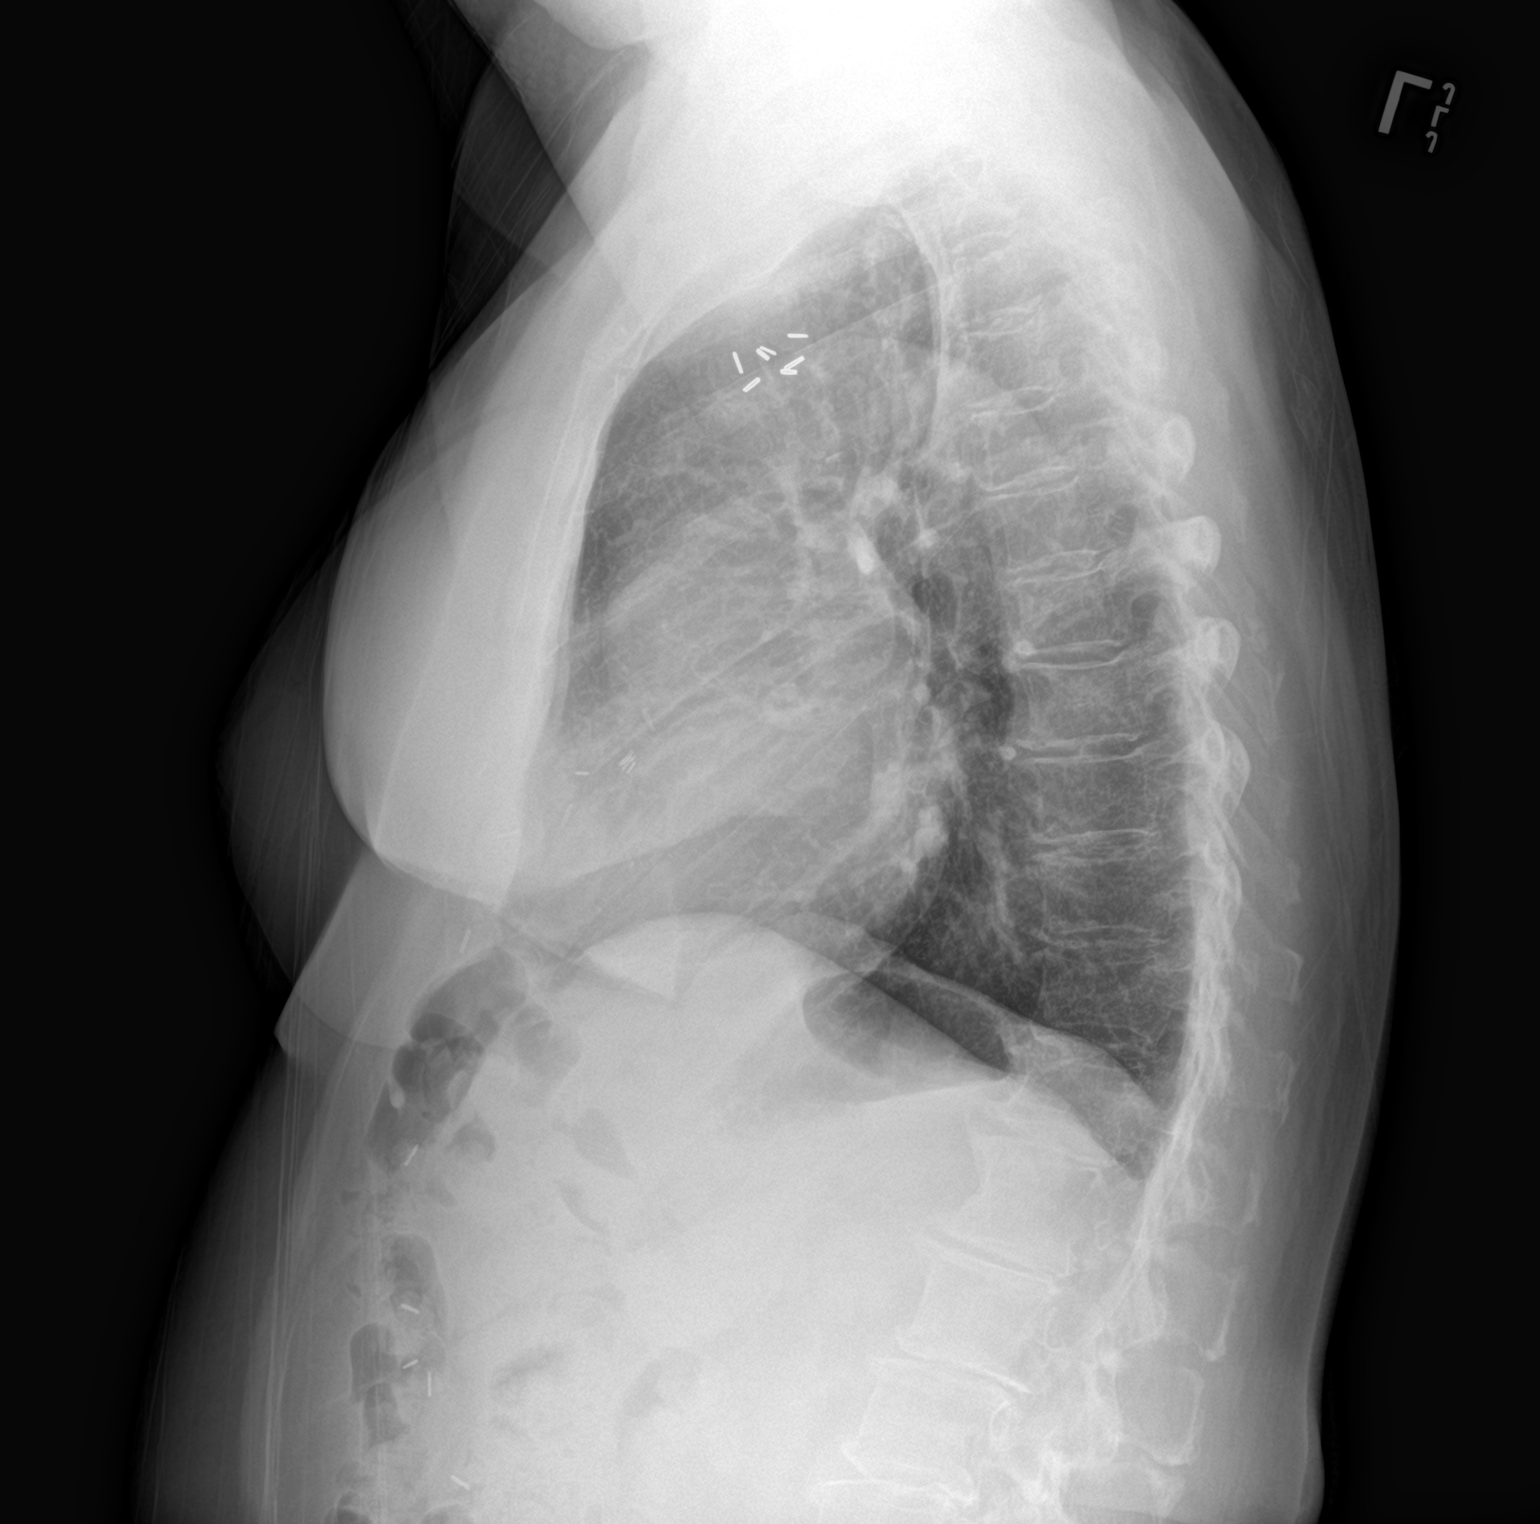

[2 of 2 positions shown; findings below may reference images not displayed]

FINDINGS: There is no edema or consolidation. Heart size and pulmonary
vascularity are normal. No adenopathy. There is atherosclerotic
calcification in the aorta. There is degenerative change in the
lower thoracic spine with thoracolumbar levoscoliosis. There are
surgical clips in each axillary region as well as over the left
breast.
IMPRESSION: No edema or consolidation. No evident adenopathy. Aortic
atherosclerosis.

## 2017-07-07 IMAGING — DX DG THORACIC SPINE 2V
2 series · 2 of 2 positions shown · non-contrast
Comparison: Chest radiograph February 13, 2013

CLINICAL DATA: Door sided.  History breast carcinoma

EXAM:
THORACIC SPINE 2 VIEWS

[t-spine ap]
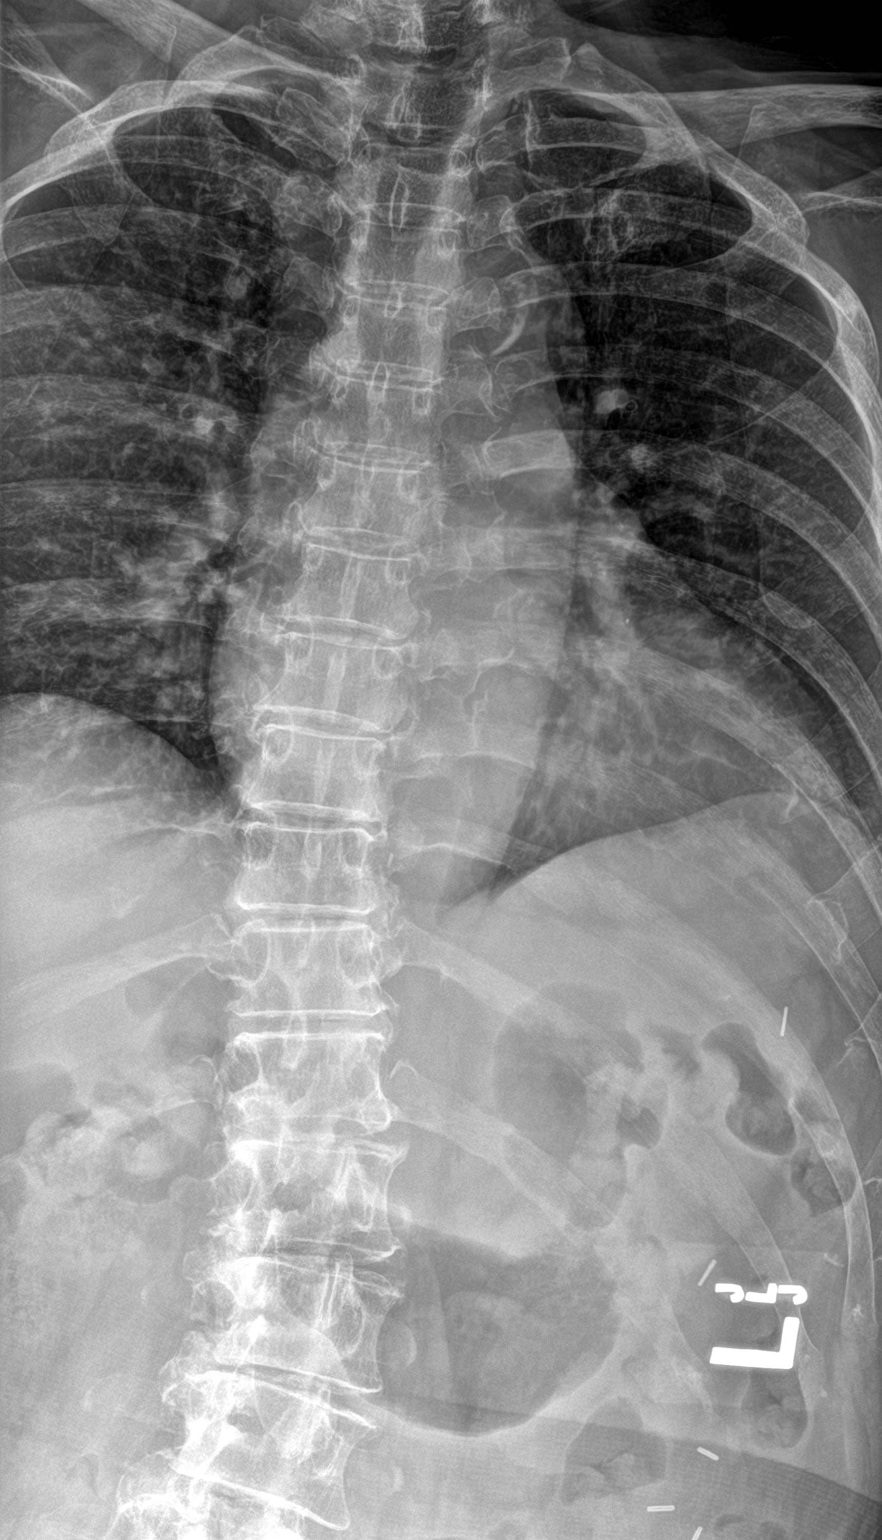

[t-spine lat]
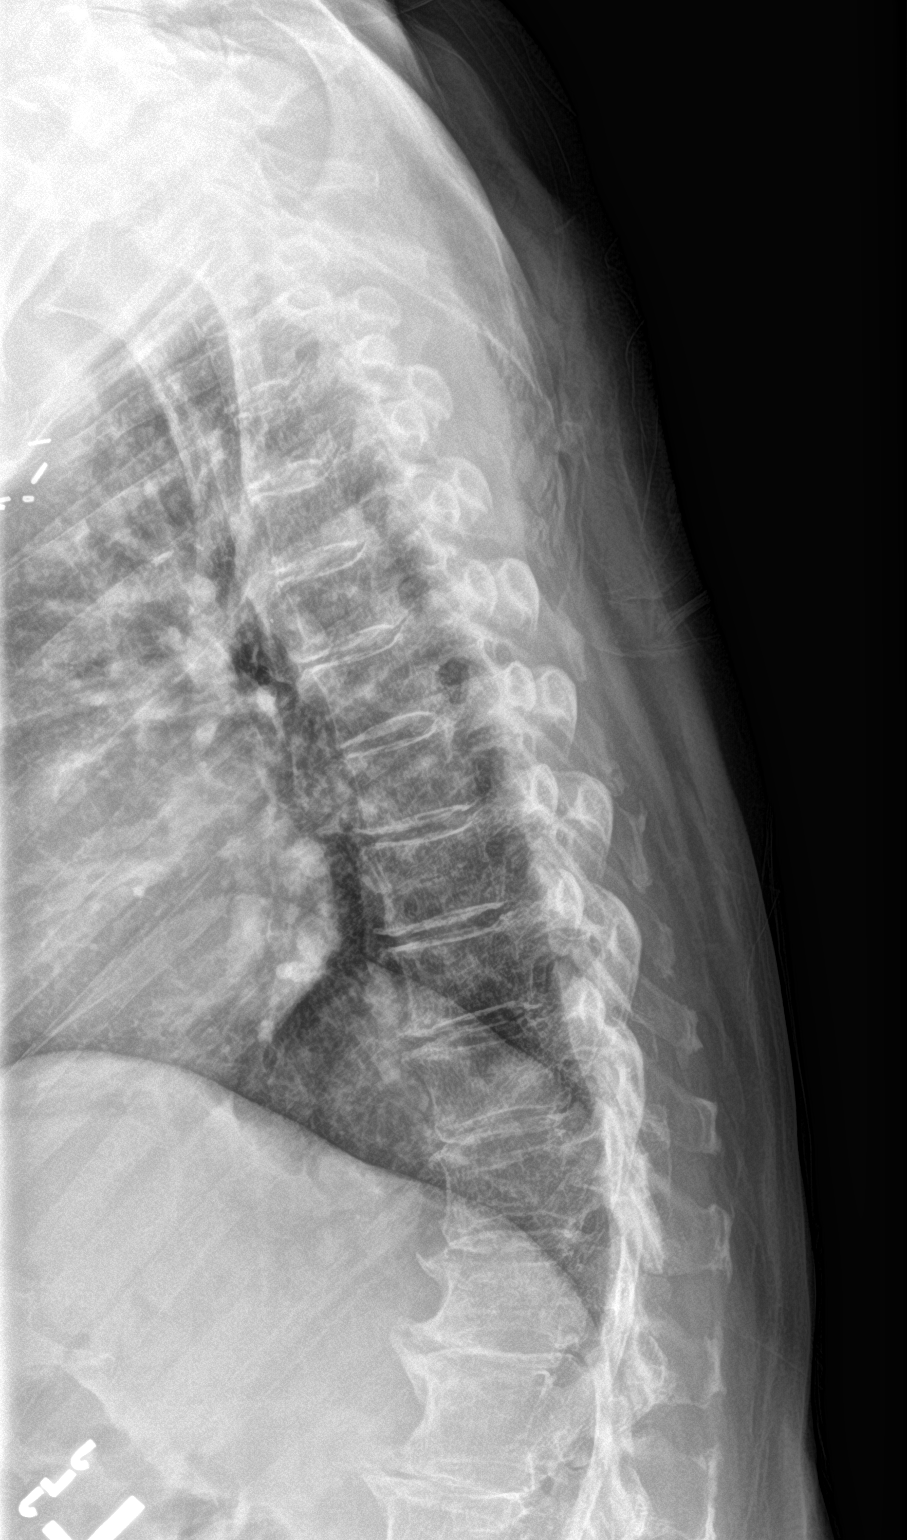

[2 of 2 positions shown; findings below may reference images not displayed]

FINDINGS: Frontal and lateral views were obtained. There is thoracolumbar
levoscoliosis with mild rotatory component. No fracture or
spondylolisthesis. There is disc space narrowing at multiple levels
in the mid and lower cervical spine. No erosive change or
paraspinous lesion. No blastic or lytic bone lesions. There is
aortic atherosclerosis.
IMPRESSION: Lower thoracic scoliosis. Osteoarthritic change at multiple levels.
No fracture or spondylolisthesis. No blastic or lytic bone lesions.
There is aortic atherosclerosis.

## 2017-08-23 ENCOUNTER — Telehealth: Payer: Self-pay | Admitting: *Deleted

## 2017-08-23 NOTE — Telephone Encounter (Signed)
Faxed ROI to [employer]; release [phone]
